# Patient Record
Sex: Male | Born: 1970 | Hispanic: No | Marital: Single | Smoking: Former smoker
Health system: Southern US, Community
[De-identification: ages and names within clinical notes are randomized; demographics above are authoritative.]

## PROBLEM LIST (undated history)

## (undated) DIAGNOSIS — I499 Cardiac arrhythmia, unspecified: Secondary | ICD-10-CM

## (undated) DIAGNOSIS — E7800 Pure hypercholesterolemia, unspecified: Secondary | ICD-10-CM

## (undated) DIAGNOSIS — K709 Alcoholic liver disease, unspecified: Secondary | ICD-10-CM

## (undated) DIAGNOSIS — K529 Noninfective gastroenteritis and colitis, unspecified: Secondary | ICD-10-CM

## (undated) DIAGNOSIS — F1010 Alcohol abuse, uncomplicated: Secondary | ICD-10-CM

## (undated) DIAGNOSIS — I10 Essential (primary) hypertension: Secondary | ICD-10-CM

## (undated) DIAGNOSIS — K2920 Alcoholic gastritis without bleeding: Secondary | ICD-10-CM

## (undated) HISTORY — PX: HERNIA REPAIR: SHX51

## (undated) HISTORY — DX: Cardiac arrhythmia, unspecified: I49.9

## (undated) HISTORY — DX: Essential (primary) hypertension: I10

---

## 2004-03-03 ENCOUNTER — Emergency Department (HOSPITAL_COMMUNITY): Admission: EM | Admit: 2004-03-03 | Discharge: 2004-03-03 | Payer: Self-pay | Admitting: Emergency Medicine

## 2005-06-10 ENCOUNTER — Emergency Department (HOSPITAL_COMMUNITY): Admission: EM | Admit: 2005-06-10 | Discharge: 2005-06-10 | Payer: Self-pay | Admitting: Emergency Medicine

## 2008-12-06 ENCOUNTER — Emergency Department (HOSPITAL_COMMUNITY): Admission: EM | Admit: 2008-12-06 | Discharge: 2008-12-06 | Payer: Self-pay | Admitting: Emergency Medicine

## 2010-12-27 ENCOUNTER — Emergency Department (HOSPITAL_COMMUNITY)
Admission: EM | Admit: 2010-12-27 | Discharge: 2010-12-28 | Disposition: A | Discharge status: Home / Self Care | Payer: Self-pay | Attending: Emergency Medicine | Admitting: Emergency Medicine

## 2010-12-27 DIAGNOSIS — H11419 Vascular abnormalities of conjunctiva, unspecified eye: Secondary | ICD-10-CM | POA: Insufficient documentation

## 2010-12-27 DIAGNOSIS — L2989 Other pruritus: Secondary | ICD-10-CM | POA: Insufficient documentation

## 2010-12-27 DIAGNOSIS — J309 Allergic rhinitis, unspecified: Secondary | ICD-10-CM | POA: Insufficient documentation

## 2010-12-27 DIAGNOSIS — H5789 Other specified disorders of eye and adnexa: Secondary | ICD-10-CM | POA: Insufficient documentation

## 2010-12-27 DIAGNOSIS — H11429 Conjunctival edema, unspecified eye: Secondary | ICD-10-CM | POA: Insufficient documentation

## 2010-12-27 DIAGNOSIS — J3489 Other specified disorders of nose and nasal sinuses: Secondary | ICD-10-CM | POA: Insufficient documentation

## 2010-12-27 DIAGNOSIS — H1010 Acute atopic conjunctivitis, unspecified eye: Secondary | ICD-10-CM | POA: Insufficient documentation

## 2010-12-27 DIAGNOSIS — L298 Other pruritus: Secondary | ICD-10-CM | POA: Insufficient documentation

## 2010-12-27 DIAGNOSIS — R6889 Other general symptoms and signs: Secondary | ICD-10-CM | POA: Insufficient documentation

## 2012-03-17 ENCOUNTER — Encounter (HOSPITAL_COMMUNITY): Payer: Self-pay | Admitting: Emergency Medicine

## 2012-03-17 ENCOUNTER — Emergency Department (HOSPITAL_COMMUNITY)
Admission: EM | Admit: 2012-03-17 | Discharge: 2012-03-18 | Disposition: A | Discharge status: Home / Self Care | Payer: Self-pay | Attending: Emergency Medicine | Admitting: Emergency Medicine

## 2012-03-17 DIAGNOSIS — S30860A Insect bite (nonvenomous) of lower back and pelvis, initial encounter: Secondary | ICD-10-CM | POA: Insufficient documentation

## 2012-03-17 DIAGNOSIS — W57XXXA Bitten or stung by nonvenomous insect and other nonvenomous arthropods, initial encounter: Secondary | ICD-10-CM | POA: Insufficient documentation

## 2012-03-17 DIAGNOSIS — M795 Residual foreign body in soft tissue: Secondary | ICD-10-CM | POA: Insufficient documentation

## 2012-03-17 DIAGNOSIS — S30861A Insect bite (nonvenomous) of abdominal wall, initial encounter: Secondary | ICD-10-CM

## 2012-03-17 MED ORDER — DOXYCYCLINE HYCLATE 100 MG PO CAPS: 100.0000 mg | ORAL_CAPSULE | Freq: Two times a day (BID) | ORAL | Status: AC

## 2012-03-17 NOTE — Discharge Instructions (Signed)
Return to ED for any redness fever or increase in pain.

## 2012-03-17 NOTE — ED Notes (Signed)
Patient has tick in bedded in his umbilical area

## 2012-03-17 NOTE — ED Provider Notes (Signed)
History     CSN: [id number]  Arrival date & time 03/17/12  2134   First MD Initiated Contact with Patient 03/17/12 2242      Chief Complaint  Patient presents with  . Tick Removal      (Csider location/radiation/quality/duration/timing/severity/associated sxs/prior treatment) The history is provided by the patient and a relative. The history is limited by a language barrier.    41 y/o male INAD c/o tick bite to umbilical area. Pt noticed tick today does not know how long it was attached. Pt works in [employer].    History reviewed. No pertinent past medical history.  Past Surgical History  Procedure Date  . Hernia repair     History reviewed. No pertinent family history.  History  Substance Use Topics  . Smoking status: Never Smoker   . Smokeless tobacco: Not on file  . Alcohol Use: Yes     occ.      Review of Systems  All other systems reviewed and are negative.    Allergies  Review of patient's allergies indicates no known allergies.  Home Medications  No current outpatient prescriptions on file.  BP 119/69  Pulse 71  Temp 98.6 F (37 C) (Oral)  Resp 16  SpO2 100%  Physical Exam  Vitals reviewed. Constitutional: He is oriented to person, place, and time. He appears well-developed and well-nourished. No distress.  HENT:  Head: Normocephalic.  Eyes: Conjunctivae and EOM are normal.  Cardiovascular: Normal rate.   Pulmonary/Chest: Effort normal.  Abdominal: Soft. Bowel sounds are normal.       Engorged tick inside umbilicus.   Musculoskeletal: Normal range of motion.  Neurological: He is alert and oriented to person, place, and time.  Psychiatric: He has a normal mood and affect.    ED Course  Procedures (including critical care time)  Labs Reviewed - No data to display No results found.   No diagnosis found.  Tick and all mouthparts removed with gentle traction secured with hemostat.   MDM  Engorged tick removed from umbilicus. Lyme  prophylaxis given. Extended dose of Doxy given for soft tissue infection prophylaxis.         Wynetta Emery, PA-C 03/19/12 1432

## 2012-03-19 NOTE — ED Provider Notes (Signed)
Medical screening examination/treatment/procedure(s) were performed by non-physician practitioner and as supervising physician I was immediately available for consultation/collaboration.   Theodore Rahrig E Tyrail Grandfield, MD 03/19/12 1539 

## 2012-05-07 ENCOUNTER — Emergency Department (HOSPITAL_COMMUNITY)
Admission: EM | Admit: 2012-05-07 | Discharge: 2012-05-07 | Disposition: A | Discharge status: Home / Self Care | Payer: Self-pay | Attending: Emergency Medicine | Admitting: Emergency Medicine

## 2012-05-07 ENCOUNTER — Encounter (HOSPITAL_COMMUNITY): Payer: Self-pay | Admitting: *Deleted

## 2012-05-07 DIAGNOSIS — W57XXXA Bitten or stung by nonvenomous insect and other nonvenomous arthropods, initial encounter: Secondary | ICD-10-CM | POA: Insufficient documentation

## 2012-05-07 DIAGNOSIS — S30860A Insect bite (nonvenomous) of lower back and pelvis, initial encounter: Secondary | ICD-10-CM | POA: Insufficient documentation

## 2012-05-07 DIAGNOSIS — M6740 Ganglion, unspecified site: Secondary | ICD-10-CM | POA: Insufficient documentation

## 2012-05-07 NOTE — ED Provider Notes (Signed)
History     CSN: [id number]  Arrival date & time 05/07/12  1746   First MD Initiated Contact with Patient 05/07/12 1904      Chief Complaint  Patient presents with  . Insect Bite    (Consider location/radiation/quality/duration/timing/severity/associated sxs/prior treatment) HPI Comments: Patient with tick removal several weeks ago from his umbilicus. Patient returns today because he feels like there is "something moving" inside the wound. Patient denies redness, swelling, pain in the area, or drainage from the area. Patient had a umbilical hernia repair performed approximately 20 years ago. He's been putting antibiotic ointment on the area. No other treatments prior. Onset was acute. Course is constant. Nothing makes the symptoms better or worse. Patient is concerned about infection.  Patient also has had a swelling to the dorsum of his right wrist for the past year. It is mildly tender. No redness, warmth, drainage. No prior treatment.  The history is provided by the patient and medical records.    History reviewed. No pertinent past medical history.  Past Surgical History  Procedure Date  . Hernia repair     No family history on file.  History  Substance Use Topics  . Smoking status: Never Smoker   . Smokeless tobacco: Not on file  . Alcohol Use: Yes     occ.      Review of Systems  Constitutional: Negative for fever.  Respiratory: Negative for cough.   Cardiovascular: Negative for chest pain.  Gastrointestinal: Negative for nausea, vomiting, abdominal pain and diarrhea.  Musculoskeletal: Positive for joint swelling. Negative for myalgias.  Skin: Positive for wound. Negative for rash.  Neurological: Negative for headaches.  Hematological: Negative for adenopathy.    Allergies  Review of patient's allergies indicates no known allergies.  Home Medications   Current Outpatient Rx  Name Route Sig Dispense Refill  . IBUPROFEN 200 MG PO TABS Oral Take 200 mg by  mouth every 6 (six) hours as needed. pain      BP 120/67  Pulse 74  Temp 98.9 F (37.2 C) (Oral)  Resp 17  SpO2 98%  Physical Exam  Nursing note and vitals reviewed. Constitutional: He appears well-developed and well-nourished.  HENT:  Head: Normocephalic and atraumatic.  Eyes: Conjunctivae are normal. Right eye exhibits no discharge. Left eye exhibits no discharge.  Neck: Normal range of motion. Neck supple.  Cardiovascular: Normal rate, regular rhythm and normal heart sounds.   Pulmonary/Chest: Effort normal and breath sounds normal.  Abdominal: Soft. There is no tenderness.  Musculoskeletal: Normal range of motion. He exhibits tenderness. He exhibits no edema.       Right wrist: He exhibits tenderness and swelling. He exhibits normal range of motion.       Patient with 1 cm, rubbery nodule on dorsum of right wrist consistent with ganglion cyst. There is no overlying erythema, warmth. Do not suspect infection. Mildly tender to palpation.  Neurological: He is alert.  Skin: Skin is warm and dry.       Area of thickened skin on the superior aspect of the inner umbilicus. There is no surrounding redness or swelling that would indicate infection, abscess, cellulitis. There is no drainage or crusting  Psychiatric: He has a normal mood and affect.    ED Course  Procedures (including critical care time)  Labs Reviewed - No data to display No results found.   1. Insect bite   2. Ganglion cyst     7:18 PM Patient seen and examined.  Vital signs reviewed and are as follows: Filed Vitals:   05/07/12 1809  BP: 120/67  Pulse: 74  Temp: 98.9 F (37.2 C)  Resp: 17   The patient was urged to return to the Emergency Department urgently with worsening pain, swelling, expanding erythema especially if it streaks away from the affected area, fever, or if they have any other concerns. Patient verbalized understanding.   Counseled on supportive treatment for ganglion.    MDM    Patient with skin irregularity just inside of his umbilicus. I suspect that this is a localized inflammatory response from his recent tick bites, potentially inflammation from part of the tick that remained within the skin. There is no evidence of abscess formation or cellulitis. No concern for tick-borne illness. Patient will continue watchful waiting for resolution.  Mildly tender ganglion on of right wrist. Conservative measures indicated.        Renne Crigler, [place] 05/07/12 1927

## 2012-05-07 NOTE — ED Notes (Addendum)
Pt reports six weeks ago was seen for tick in umbilicus which was removed.  Pt reports he noted today something moving in umbilicus.  Pt reports he saw some sort of bug which he can not describe or say what color it was. Pt denies pain. Pt would also like to be checked for a hernia

## 2013-09-11 ENCOUNTER — Encounter (HOSPITAL_COMMUNITY): Payer: Self-pay | Admitting: Emergency Medicine

## 2013-09-11 DIAGNOSIS — R1013 Epigastric pain: Secondary | ICD-10-CM | POA: Insufficient documentation

## 2013-09-11 DIAGNOSIS — R7402 Elevation of levels of lactic acid dehydrogenase (LDH): Secondary | ICD-10-CM | POA: Insufficient documentation

## 2013-09-11 DIAGNOSIS — R7401 Elevation of levels of liver transaminase levels: Secondary | ICD-10-CM | POA: Insufficient documentation

## 2013-09-11 DIAGNOSIS — K2920 Alcoholic gastritis without bleeding: Secondary | ICD-10-CM | POA: Insufficient documentation

## 2013-09-11 NOTE — ED Notes (Signed)
Pt. reports upper abdominal with nausea and vomitting / no diarrhea , pt. stated heavy ETOH drinking last night with " hang over " this morning .

## 2013-09-12 ENCOUNTER — Emergency Department (HOSPITAL_COMMUNITY)
Admission: EM | Admit: 2013-09-12 | Discharge: 2013-09-12 | Disposition: A | Discharge status: Home / Self Care | Payer: Self-pay | Attending: Emergency Medicine | Admitting: Emergency Medicine

## 2013-09-12 DIAGNOSIS — R109 Unspecified abdominal pain: Secondary | ICD-10-CM

## 2013-09-12 DIAGNOSIS — K2920 Alcoholic gastritis without bleeding: Secondary | ICD-10-CM

## 2013-09-12 HISTORY — DX: Alcoholic liver disease, unspecified: K70.9

## 2013-09-12 HISTORY — DX: Alcoholic gastritis without bleeding: K29.20

## 2013-09-12 HISTORY — DX: Alcohol abuse, uncomplicated: F10.10

## 2013-09-12 LAB — URINALYSIS, ROUTINE W REFLEX MICROSCOPIC
Glucose, UA: NEGATIVE mg/dL
Ketones, ur: NEGATIVE mg/dL
Leukocytes, UA: NEGATIVE
Nitrite: NEGATIVE
Protein, ur: NEGATIVE mg/dL
pH: 6.5 (ref 5.0–8.0)

## 2013-09-12 LAB — COMPREHENSIVE METABOLIC PANEL
AST: 38 U/L — ABNORMAL HIGH (ref 0–37)
Albumin: 3.8 g/dL (ref 3.5–5.2)
BUN: 10 mg/dL (ref 6–23)
Calcium: 9 mg/dL (ref 8.4–10.5)
Creatinine, Ser: 0.7 mg/dL (ref 0.50–1.35)
Total Protein: 7.5 g/dL (ref 6.0–8.3)

## 2013-09-12 LAB — LIPASE, BLOOD: Lipase: 42 U/L (ref 11–59)

## 2013-09-12 LAB — CBC WITH DIFFERENTIAL/PLATELET
Basophils Absolute: 0 10*3/uL (ref 0.0–0.1)
Basophils Relative: 0 % (ref 0–1)
Eosinophils Absolute: 0 10*3/uL (ref 0.0–0.7)
Eosinophils Relative: 0 % (ref 0–5)
HCT: 43 % (ref 39.0–52.0)
MCH: 30.8 pg (ref 26.0–34.0)
MCHC: 35.3 g/dL (ref 30.0–36.0)
MCV: 87 fL (ref 78.0–100.0)
Monocytes Absolute: 1 10*3/uL (ref 0.1–1.0)
Monocytes Relative: 9 % (ref 3–12)
Neutro Abs: 7.3 10*3/uL (ref 1.7–7.7)
RDW: 12.9 % (ref 11.5–15.5)

## 2013-09-12 MED ORDER — TRAMADOL HCL 50 MG PO TABS: 50.0000 mg | ORAL_TABLET | Freq: Four times a day (QID) | ORAL | Status: DC | PRN

## 2013-09-12 NOTE — ED Notes (Signed)
Pt dc to home. Pt sts understanding to dc instructions. Pt ambulatory to exit without difficulty. 

## 2013-09-12 NOTE — ED Provider Notes (Signed)
CSN: [id number]     Arrival date & time 09/11/13  2259 History   First MD Initiated Contact with Patient 09/12/13 [phone]     Chief Complaint  Patient presents with  . Abdominal Pain   (Consider location/radiation/quality/duration/timing/severity/associated sxs/prior Treatment) HPI Comments: 42 year old male with a history of heavy alcohol use presents with a complaint of abdominal pain which started several hours after drinking 12 beers and 3 tequila shots. He admits to having associated nausea and vomiting but states that this is all very typical after he drinks as much. Over last 6 hours while he has been waiting, his symptoms have essentially resolved, he has minimal residual tenderness, no nausea and no other complaints.  Patient is a 42 y.o. male presenting with abdominal pain. The history is provided by the patient.  Abdominal Pain   Past Medical History  Diagnosis Date  . Alcoholic gastritis   . 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 Alcohol abuse    Past Surgical History  Procedure Laterality Date  . Hernia repair     No family history on file. History  Substance Use Topics  . Smoking status: Never Smoker   . Smokeless tobacco: Not on file  . Alcohol Use: Yes     Comment: occ.    Review of Systems  Gastrointestinal: Positive for abdominal pain.  All other systems reviewed and are negative.    Allergies  Review of patient's allergies indicates no known allergies.  Home Medications   Current Outpatient Rx  Name  Route  Sig  Dispense  Refill  . polyethylene glycol (MIRALAX / GLYCOLAX) packet   Oral   Take 17 g by mouth daily as needed for moderate constipation.         . traMADol (ULTRAM) 50 MG tablet   Oral   Take 1 tablet (50 mg total) by mouth every 6 (six) hours as needed.   10 tablet   0    BP 118/91  Pulse 97  Temp(Src) 98.5 F (36.9 C) (Oral)  Resp 14  Ht 5\' 5"  (1.651 m)  Wt 179 lb (81.194 kg)  BMI 29.79 kg/m2  SpO2 97% Physical Exam  Nursing  note and vitals reviewed. Constitutional: He appears well-developed and well-nourished. No distress.  HENT:  Head: Normocephalic and atraumatic.  Mouth/Throat: Oropharynx is clear and moist. No oropharyngeal exudate.  Eyes: Conjunctivae and EOM are normal. Pupils are equal, round, and reactive to light. Right eye exhibits no discharge. Left eye exhibits no discharge. No scleral icterus.  Neck: Normal range of motion. Neck supple. No JVD present. No thyromegaly present.  Cardiovascular: Normal rate, regular rhythm, normal heart sounds and intact distal pulses.  Exam reveals no gallop and no friction rub.   No murmur heard. Pulmonary/Chest: Effort normal and breath sounds normal. No respiratory distress. He has no wheezes. He has no rales.  Abdominal: Soft. Bowel sounds are normal. He exhibits no distension and no mass. There is tenderness ( Mild epigastric tenderness, no guarding, no other tenderness, no guarding).  Musculoskeletal: Normal range of motion. He exhibits no edema and no tenderness.  Lymphadenopathy:    He has no cervical adenopathy.  Neurological: He is alert. Coordination normal.  Skin: Skin is warm and dry. No rash noted. No erythema.  Psychiatric: He has a normal mood and affect. His behavior is normal.    ED Course  Procedures (including critical care time) Labs Review Labs Reviewed  CBC WITH DIFFERENTIAL - Abnormal; Notable for the following:  WBC 10.9 (*)    All other components within normal limits  COMPREHENSIVE METABOLIC PANEL - Abnormal; Notable for the following:    Potassium 3.1 (*)    Glucose, Bld 153 (*)    AST 38 (*)    ALT 70 (*)    All other components within normal limits  LIPASE, BLOOD  URINALYSIS, ROUTINE W REFLEX MICROSCOPIC   Imaging Review No results found.  EKG Interpretation   None       MDM   1. Abdominal pain   2. Alcoholic gastritis   3. Transaminitis    Overall the patient appears well. He does have slight transaminitis, no  other significant findings of concern and has a benign abdomen. I suspect alcoholic gastritis as a source of his abdominal pain. He is stable for discharge, requests discharge, recommended decreased alcohol intake, understanding expressed.    Vida Roller, MD 09/12/13 [phone]

## 2013-12-23 ENCOUNTER — Encounter (HOSPITAL_COMMUNITY): Payer: Self-pay | Admitting: Emergency Medicine

## 2013-12-23 ENCOUNTER — Emergency Department (HOSPITAL_COMMUNITY): Payer: Self-pay

## 2013-12-23 ENCOUNTER — Emergency Department (HOSPITAL_COMMUNITY)
Admission: EM | Admit: 2013-12-23 | Discharge: 2013-12-23 | Disposition: A | Discharge status: Home / Self Care | Payer: Self-pay | Attending: Emergency Medicine | Admitting: Emergency Medicine

## 2013-12-23 DIAGNOSIS — R109 Unspecified abdominal pain: Secondary | ICD-10-CM | POA: Insufficient documentation

## 2013-12-23 DIAGNOSIS — R197 Diarrhea, unspecified: Secondary | ICD-10-CM | POA: Insufficient documentation

## 2013-12-23 DIAGNOSIS — R209 Unspecified disturbances of skin sensation: Secondary | ICD-10-CM | POA: Insufficient documentation

## 2013-12-23 LAB — URINALYSIS, ROUTINE W REFLEX MICROSCOPIC
Bilirubin Urine: NEGATIVE
Glucose, UA: NEGATIVE mg/dL
Hgb urine dipstick: NEGATIVE
Ketones, ur: NEGATIVE mg/dL
LEUKOCYTES UA: NEGATIVE
Nitrite: NEGATIVE
PH: 5.5 (ref 5.0–8.0)
Protein, ur: NEGATIVE mg/dL
Specific Gravity, Urine: 1.024 (ref 1.005–1.030)
Urobilinogen, UA: 0.2 mg/dL (ref 0.0–1.0)

## 2013-12-23 LAB — CBC WITH DIFFERENTIAL/PLATELET
BASOS ABS: 0 10*3/uL (ref 0.0–0.1)
BASOS PCT: 0 % (ref 0–1)
EOS ABS: 0.1 10*3/uL (ref 0.0–0.7)
Eosinophils Relative: 1 % (ref 0–5)
HEMATOCRIT: 47 % (ref 39.0–52.0)
Hemoglobin: 16.6 g/dL (ref 13.0–17.0)
Lymphocytes Relative: 16 % (ref 12–46)
Lymphs Abs: 1.7 10*3/uL (ref 0.7–4.0)
MCH: 30.1 pg (ref 26.0–34.0)
MCHC: 35.3 g/dL (ref 30.0–36.0)
MCV: 85.3 fL (ref 78.0–100.0)
MONO ABS: 0.9 10*3/uL (ref 0.1–1.0)
Monocytes Relative: 8 % (ref 3–12)
Neutro Abs: 8 10*3/uL — ABNORMAL HIGH (ref 1.7–7.7)
Neutrophils Relative %: 74 % (ref 43–77)
Platelets: 183 10*3/uL (ref 150–400)
RBC: 5.51 MIL/uL (ref 4.22–5.81)
RDW: 12.7 % (ref 11.5–15.5)
WBC: 10.8 10*3/uL — ABNORMAL HIGH (ref 4.0–10.5)

## 2013-12-23 LAB — TROPONIN I: Troponin I: <0.3 ng/mL (ref <0.30)

## 2013-12-23 LAB — COMPREHENSIVE METABOLIC PANEL
ALBUMIN: 4.4 g/dL (ref 3.5–5.2)
ALT: 114 U/L — ABNORMAL HIGH (ref 0–53)
AST: 58 U/L — AB (ref 0–37)
Alkaline Phosphatase: 80 U/L (ref 39–117)
BILIRUBIN TOTAL: 0.9 mg/dL (ref 0.3–1.2)
BUN: 17 mg/dL (ref 6–23)
CALCIUM: 9.4 mg/dL (ref 8.4–10.5)
CHLORIDE: 97 meq/L (ref 96–112)
CO2: 18 mEq/L — ABNORMAL LOW (ref 19–32)
CREATININE: 0.93 mg/dL (ref 0.50–1.35)
GFR calc Af Amer: >90 mL/min (ref >90)
GFR calc non Af Amer: >90 mL/min (ref >90)
Glucose, Bld: 114 mg/dL — ABNORMAL HIGH (ref 70–99)
Potassium: 4.1 mEq/L (ref 3.7–5.3)
Sodium: 132 mEq/L — ABNORMAL LOW (ref 137–147)
TOTAL PROTEIN: 8.5 g/dL — AB (ref 6.0–8.3)

## 2013-12-23 LAB — LIPASE, BLOOD: LIPASE: 40 U/L (ref 11–59)

## 2013-12-23 MED ORDER — DICYCLOMINE HCL 10 MG PO CAPS
10.0000 mg | ORAL_CAPSULE | Freq: Once | ORAL | Status: AC
  Administered 2013-12-23: 10 mg via ORAL
  Filled 2013-12-23: qty 1

## 2013-12-23 MED ORDER — LOPERAMIDE HCL 2 MG PO CAPS
2.0000 mg | ORAL_CAPSULE | ORAL | Status: DC | PRN
  Administered 2013-12-23: 2 mg via ORAL
  Filled 2013-12-23: qty 1

## 2013-12-23 MED ORDER — LOPERAMIDE HCL 2 MG PO CAPS: 2.0000 mg | ORAL_CAPSULE | ORAL | Status: DC | PRN

## 2013-12-23 NOTE — ED Notes (Signed)
Patient c/o abd pain, flank pain, diarrhea, and numbness to both hands. Patient states he took peptobismal and gingerale, did not help.

## 2013-12-23 NOTE — ED Provider Notes (Signed)
Medical screening examination/treatment/procedure(s) were performed by non-physician practitioner and as supervising physician I was immediately available for consultation/collaboration.   EKG Interpretation   Date/Time:  Wednesday December 23 2013 05:18:32 EDT Ventricular Rate:  98 PR Interval:  153 QRS Duration: 87 QT Interval:  347 QTC Calculation: 443 R Axis:   -30 Text Interpretation:  Age not entered, assumed to be  43 years old for  purpose of ECG interpretation Sinus rhythm Left axis deviation Confirmed  by Rhunette CroftNANAVATI, MD, Deann Mclaine ([id number]) on 12/23/2013 7:03:28 AM       Derwood KaplanAnkit Darryle Dennie, MD 12/23/13 [phone]

## 2013-12-23 NOTE — ED Provider Notes (Signed)
CSN: [id number]     Arrival date & time 12/23/13  0428 History   First MD Initiated Contact with Patient 12/23/13 [phone]     Chief Complaint  Patient presents with  . Diarrhea  . Abdominal Pain  . Flank Pain    bilateral  . Numbness    bilateral hands     (Consider location/radiation/quality/duration/timing/severity/associated sxs/prior Treatment) Patient is a 43 y.o. male presenting with diarrhea, abdominal pain, and flank pain. The history is provided by the patient. No language interpreter was used.  Diarrhea Associated symptoms: abdominal pain   Associated symptoms: no chills, no fever and no vomiting   Abdominal Pain Associated symptoms: diarrhea   Associated symptoms: no chest pain, no chills, no constipation, no dysuria, no fatigue, no fever, no hematuria, no nausea, no shortness of breath and no vomiting   Flank Pain Associated symptoms include abdominal pain. Pertinent negatives include no chest pain, chills, fatigue, fever, nausea or vomiting.   Pt is a 43yo male presenting to ED c/o watery diarrhea that started yesterday, associated with right flank pain, abdominal pain, and numbness in both hands.  Pt states he has had >20 episodes of water diarrhea since yesterday. Denies blood or mucous in stool. Pt states right sided flank and abdominal pian is aching and sharp, intermittent, 5/10, nothing makes better or worse.  Pt states he recently traveled to [place] to visit with family for vacation but denies sick contacts. He admits to drinking 5-6 cans of beers per day while on vacation.  He also reports drinking a "juice" of pineapple juice and vinegar which he was drinking because he states he normally gets constipated.  Last drank yesterday but he states he has never had diarrhea this bad after drinking it.  Reports hx of abdominal hernia repair 25 years ago, denies known complications from surgery. Denies fever, nausea or vomiting. Denies hx of renal stones. Denies hx of living  problems.  Denies other significant PMH.   History reviewed. No pertinent past medical history. History reviewed. No pertinent past surgical history. No family history on file. History  Substance Use Topics  . Smoking status: Never Smoker   . Smokeless tobacco: Not on file  . Alcohol Use: Yes     Comment: varies    Review of Systems  Constitutional: Negative for fever, chills, appetite change and fatigue.  Respiratory: Negative for shortness of breath.   Cardiovascular: Negative for chest pain.  Gastrointestinal: Positive for abdominal pain and diarrhea. Negative for nausea, vomiting and constipation.  Genitourinary: Positive for flank pain ( right). Negative for dysuria and hematuria.  All other systems reviewed and are negative.     Allergies  Review of patient's allergies indicates no known allergies.  Home Medications   Current Outpatient Rx  Name  Route  Sig  Dispense  Refill  . bismuth subsalicylate (PEPTO BISMOL) 262 MG/15ML suspension   Oral   Take 30 mLs by mouth every 6 (six) hours as needed for indigestion or diarrhea or loose stools.         Marland Kitchen loperamide (IMODIUM) 2 MG capsule   Oral   Take 1 capsule (2 mg total) by mouth as needed for diarrhea or loose stools. Take 1 pill as needed for diarrhea, do not exceed 16mg /day (8 tabs)   30 capsule   0    BP 128/69  Pulse 83  Temp(Src) 98.2 F (36.8 C) (Oral)  Resp 16  Ht 5\' 5"  (1.651 m)  Wt 173 lb (  78.472 kg)  BMI 28.79 kg/m2  SpO2 94% Physical Exam  Nursing note and vitals reviewed. Constitutional: He appears well-developed and well-nourished.  HENT:  Head: Normocephalic and atraumatic.  Eyes: Conjunctivae are normal. No scleral icterus.  Neck: Normal range of motion.  Cardiovascular: Normal rate, regular rhythm and normal heart sounds.   Pulmonary/Chest: Effort normal and breath sounds normal. No respiratory distress. He has no wheezes. He has no rales. He exhibits no tenderness.  Abdominal: Soft.  Bowel sounds are normal. He exhibits no distension and no mass. There is tenderness. There is no rebound, no guarding and no CVA tenderness.  Soft, obese abdomen, tenderness in right abdomen, greatest in RUQ.  No epigastric pain. No rebound, guarding, or masses.   Musculoskeletal: Normal range of motion.  Neurological: He is alert.  Skin: Skin is warm and dry.    ED Course  Procedures (including critical care time) Labs Review Labs Reviewed  CBC WITH DIFFERENTIAL - Abnormal; Notable for the following:    WBC 10.8 (*)    Neutro Abs 8.0 (*)    All other components within normal limits  COMPREHENSIVE METABOLIC PANEL - Abnormal; Notable for the following:    Sodium 132 (*)    CO2 18 (*)    Glucose, Bld 114 (*)    Total Protein 8.5 (*)    AST 58 (*)    ALT 114 (*)    All other components within normal limits  LIPASE, BLOOD  TROPONIN I  URINALYSIS, ROUTINE W REFLEX MICROSCOPIC   Imaging Review US Abdomen Complete  12/23/2013   CLINICAL DATA:  Right upper quadrant pain. Elevated liver function tests.  EXAM: ULTRASOUND ABDOMEN COMPLETE  COMPARISON:  None.  FINDINGS: Gallbladder:  No gallstones or wall thickening visualized. No sonographic Murphy sign noted.  Common bile duct:  Diameter: 0.4 cm  Liver:  Demonstrates increased echogenicity and coarsened echotexture. No focal lesion or intrahepatic biliary ductal dilatation. There is normal hepatopetal flow in the portal vein.  IVC:  No abnormality visualized.  Pancreas:  Visualized portion unremarkable.  Spleen:  Size and appearance within normal limits.  Right Kidney:  Length: 11.6 cm. Echogenicity within normal limits. No mass or hydronephrosis visualized.  Left Kidney:  Length: 12.1 cm. Echogenicity within normal limits. No mass or hydronephrosis visualized.  Abdominal aorta:  No aneurysm visualized.  Other findings:  None.  IMPRESSION: Fatty infiltration of the liver. The examination is otherwise negative.   Electronically Signed   By: Drusilla Kanner M.D.   On: 12/23/2013 07:52     EKG Interpretation   Date/Time:  Wednesday December 23 2013 05:18:32 EDT Ventricular Rate:  98 PR Interval:  153 QRS Duration: 87 QT Interval:  347 QTC Calculation: 443 R Axis:   -30 Text Interpretation:  Age not entered, assumed to be  43 years old for  purpose of ECG interpretation Sinus rhythm Left axis deviation Confirmed  by Rhunette Croft, MD, ANKIT [phone]) on 12/23/2013 7:03:28 AM      MDM   Final diagnoses:  Diarrhea  Abdominal pain    Pt is a 43yo male c/o watery diarrhea that started yesterday. Denies fever, nausea or vomiting. Pt appears well hydrated. NAD. Vitals: unremarkable. Abd-soft, obese, mild tenderness in right side of abdomen.  Low concern for surgical abdomen.    Labs: unremarkable, except slightly elevated AST-58 and ALT-114.  Abd U/S- significant for fatty infiltration of liver, otherwise negative exam.    Vitals: pt afebrile in ED, normal BP.  Discussed  pt with Dr. Rhunette CroftNanavati, not concerned for emergent process taking place at this time. Symptoms likely viral in nature. Will tx symptomatically with imodium.  Will discharge pt home and have him f/u with [facility].  Return precautions provided. Pt verbalized understanding and agreement with tx plan.       Junius FinnerErin O'Malley, PA-C 12/23/13 [id number]

## 2014-08-26 ENCOUNTER — Emergency Department (HOSPITAL_COMMUNITY)
Admission: EM | Admit: 2014-08-26 | Discharge: 2014-08-27 | Disposition: A | Discharge status: Home / Self Care | Payer: Self-pay | Attending: Emergency Medicine | Admitting: Emergency Medicine

## 2014-08-26 ENCOUNTER — Encounter (HOSPITAL_COMMUNITY): Payer: Self-pay | Admitting: Emergency Medicine

## 2014-08-26 DIAGNOSIS — R1032 Left lower quadrant pain: Secondary | ICD-10-CM

## 2014-08-26 DIAGNOSIS — Z8639 Personal history of other endocrine, nutritional and metabolic disease: Secondary | ICD-10-CM | POA: Insufficient documentation

## 2014-08-26 DIAGNOSIS — K5900 Constipation, unspecified: Secondary | ICD-10-CM | POA: Insufficient documentation

## 2014-08-26 HISTORY — DX: Pure hypercholesterolemia, unspecified: E78.00

## 2014-08-26 NOTE — ED Notes (Signed)
Pt. reports intermittent LLQ onset yesterday , denies nausea or vomitting , no diarrhea/ denies fever or chills.

## 2014-08-27 ENCOUNTER — Emergency Department (HOSPITAL_COMMUNITY): Payer: Self-pay

## 2014-08-27 LAB — COMPREHENSIVE METABOLIC PANEL
ALBUMIN: 3.9 g/dL (ref 3.5–5.2)
ALK PHOS: 80 U/L (ref 39–117)
ALT: 31 U/L (ref 0–53)
AST: 24 U/L (ref 0–37)
Anion gap: 16 — ABNORMAL HIGH (ref 5–15)
BILIRUBIN TOTAL: 0.3 mg/dL (ref 0.3–1.2)
BUN: 11 mg/dL (ref 6–23)
CO2: 22 mEq/L (ref 19–32)
Calcium: 9.3 mg/dL (ref 8.4–10.5)
Chloride: 101 mEq/L (ref 96–112)
Creatinine, Ser: 0.73 mg/dL (ref 0.50–1.35)
GFR calc Af Amer: >90 mL/min (ref >90)
GFR calc non Af Amer: >90 mL/min (ref >90)
GLUCOSE: 121 mg/dL — AB (ref 70–99)
POTASSIUM: 3.7 meq/L (ref 3.7–5.3)
Sodium: 139 mEq/L (ref 137–147)
Total Protein: 7.2 g/dL (ref 6.0–8.3)

## 2014-08-27 LAB — CBC WITH DIFFERENTIAL/PLATELET
Basophils Absolute: 0 10*3/uL (ref 0.0–0.1)
Basophils Relative: 0 % (ref 0–1)
Eosinophils Absolute: 0.1 10*3/uL (ref 0.0–0.7)
Eosinophils Relative: 2 % (ref 0–5)
HCT: 42.5 % (ref 39.0–52.0)
HEMOGLOBIN: 14.8 g/dL (ref 13.0–17.0)
LYMPHS ABS: 2.6 10*3/uL (ref 0.7–4.0)
Lymphocytes Relative: 42 % (ref 12–46)
MCH: 30.1 pg (ref 26.0–34.0)
MCHC: 34.8 g/dL (ref 30.0–36.0)
MCV: 86.4 fL (ref 78.0–100.0)
MONOS PCT: 8 % (ref 3–12)
Monocytes Absolute: 0.5 10*3/uL (ref 0.1–1.0)
NEUTROS ABS: 3 10*3/uL (ref 1.7–7.7)
NEUTROS PCT: 48 % (ref 43–77)
Platelets: 202 10*3/uL (ref 150–400)
RBC: 4.92 MIL/uL (ref 4.22–5.81)
RDW: 12.9 % (ref 11.5–15.5)
WBC: 6.2 10*3/uL (ref 4.0–10.5)

## 2014-08-27 LAB — URINALYSIS, ROUTINE W REFLEX MICROSCOPIC
Bilirubin Urine: NEGATIVE
Glucose, UA: NEGATIVE mg/dL
HGB URINE DIPSTICK: NEGATIVE
KETONES UR: NEGATIVE mg/dL
Leukocytes, UA: NEGATIVE
Nitrite: NEGATIVE
PH: 6.5 (ref 5.0–8.0)
Protein, ur: NEGATIVE mg/dL
SPECIFIC GRAVITY, URINE: 1.003 — AB (ref 1.005–1.030)
Urobilinogen, UA: 0.2 mg/dL (ref 0.0–1.0)

## 2014-08-27 MED ORDER — MAGNESIUM CITRATE PO SOLN
1.0000 | Freq: Once | ORAL | Status: AC
  Administered 2014-08-27: 1 via ORAL
  Filled 2014-08-27: qty 296

## 2014-08-27 NOTE — ED Provider Notes (Signed)
CSN: [id number]     Arrival date & time 08/26/14  2332 History  This chart was scribed for Edward Boozeavid Jalen Daluz, MD by Freida Busmaniana Omoyeni, ED Scribe. This patient was seen in room D35C/D35C and the patient's care was started 2:00 AM.   Chief Complaint  Patient presents with  . Abdominal Pain     The history is provided by the patient. No language interpreter was used.     HPI Comments:  Edward Harrell is a 43 y.o. male who presents to the Emergency Department complaining of intermittent mild LLQ abdominal pain that started yesterday. Pt reports h/o of similar pain a few months ago. He also reports associated constipation. He notes pain is exacerbated when he holds his pee and alleviated after a BM. He denies nausea, fever, chills and diarrhea.     Past Medical History  Diagnosis Date  . Hypercholesterolemia    Past Surgical History  Procedure Laterality Date  . Hernia repair     No family history on file. History  Substance Use Topics  . Smoking status: Never Smoker   . Smokeless tobacco: Not on file  . Alcohol Use: Yes     Comment: varies    Review of Systems  Constitutional: Negative for fever and chills.  Gastrointestinal: Positive for abdominal pain and constipation. Negative for nausea, vomiting and diarrhea.  All other systems reviewed and are negative.     Allergies  Review of patient's allergies indicates no known allergies.  Home Medications   Prior to Admission medications   Medication Sig Start Date End Date Taking? Authorizing Provider  bismuth subsalicylate (PEPTO BISMOL) 262 MG/15ML suspension Take 30 mLs by mouth every 6 (six) hours as needed for indigestion or diarrhea or loose stools.   Yes Historical Provider, MD  loperamide (IMODIUM) 2 MG capsule Take 1 capsule (2 mg total) by mouth as needed for diarrhea or loose stools. Take 1 pill as needed for diarrhea, do not exceed 16mg /day (8 tabs) 12/23/13  Yes Junius FinnerErin O'Malley, PA-C  OVER THE COUNTER MEDICATION Take 1  tablet by mouth daily as needed (pain/fever). "xl3"   Yes Historical Provider, MD   BP 117/69 mmHg  Pulse 106  Temp(Src) 97.5 F (36.4 C) (Oral)  Resp 21  Ht 5\' 4"  (1.626 m)  Wt 165 lb (74.844 kg)  BMI 28.31 kg/m2  SpO2 100% Physical Exam  Constitutional: He is oriented to person, place, and time. He appears well-developed and well-nourished.  HENT:  Head: Normocephalic and atraumatic.  Eyes: Conjunctivae are normal. Pupils are equal, round, and reactive to light.  Neck: Neck supple. No JVD present.  Cardiovascular: Normal rate and regular rhythm.   Pulmonary/Chest: Effort normal and breath sounds normal.  Abdominal: Soft. Bowel sounds are normal. He exhibits no distension and no mass. There is no tenderness.  Genitourinary: Rectum normal and prostate normal.  Normal sized prostate  Musculoskeletal: Normal range of motion. He exhibits no edema.  Lymphadenopathy:    He has no cervical adenopathy.  Neurological: He is alert and oriented to person, place, and time. He has normal reflexes. No cranial nerve deficit. Coordination normal.  Skin: Skin is warm and dry. No rash noted.  Psychiatric: He has a normal mood and affect. His behavior is normal. Thought content normal.  Nursing note and vitals reviewed.   ED Course  Procedures   DIAGNOSTIC STUDIES:  Oxygen Saturation is 99% on RA, normal by my interpretation.    COORDINATION OF CARE:  2:03 AM Discussed treatment plan  with pt at bedside and pt agreed to plan.  Labs Review Results for orders placed or performed during the hospital encounter of 08/26/14  CBC with Differential  Result Value Ref Range   WBC 6.2 4.0 - 10.5 K/uL   RBC 4.92 4.22 - 5.81 MIL/uL   Hemoglobin 14.8 13.0 - 17.0 g/dL   HCT 16.142.5 09.639.0 - 04.552.0 %   MCV 86.4 78.0 - 100.0 fL   MCH 30.1 26.0 - 34.0 pg   MCHC 34.8 30.0 - 36.0 g/dL   RDW 40.912.9 81.111.5 - 91.415.5 %   Platelets 202 150 - 400 K/uL   Neutrophils Relative % 48 43 - 77 %   Neutro Abs 3.0 1.7 - 7.7  K/uL   Lymphocytes Relative 42 12 - 46 %   Lymphs Abs 2.6 0.7 - 4.0 K/uL   Monocytes Relative 8 3 - 12 %   Monocytes Absolute 0.5 0.1 - 1.0 K/uL   Eosinophils Relative 2 0 - 5 %   Eosinophils Absolute 0.1 0.0 - 0.7 K/uL   Basophils Relative 0 0 - 1 %   Basophils Absolute 0.0 0.0 - 0.1 K/uL  Comprehensive metabolic panel  Result Value Ref Range   Sodium 139 137 - 147 mEq/L   Potassium 3.7 3.7 - 5.3 mEq/L   Chloride 101 96 - 112 mEq/L   CO2 22 19 - 32 mEq/L   Glucose, Bld 121 (H) 70 - 99 mg/dL   BUN 11 6 - 23 mg/dL   Creatinine, Ser 7.820.73 0.50 - 1.35 mg/dL   Calcium 9.3 8.4 - 95.610.5 mg/dL   Total Protein 7.2 6.0 - 8.3 g/dL   Albumin 3.9 3.5 - 5.2 g/dL   AST 24 0 - 37 U/L   ALT 31 0 - 53 U/L   Alkaline Phosphatase 80 39 - 117 U/L   Total Bilirubin 0.3 0.3 - 1.2 mg/dL   GFR calc non Af Amer >90 >90 mL/min   GFR calc Af Amer >90 >90 mL/min   Anion gap 16 (H) 5 - 15  Urinalysis, Routine w reflex microscopic  Result Value Ref Range   Color, Urine YELLOW YELLOW   APPearance CLOUDY (A) CLEAR   Specific Gravity, Urine 1.003 (L) 1.005 - 1.030   pH 6.5 5.0 - 8.0   Glucose, UA NEGATIVE NEGATIVE mg/dL   Hgb urine dipstick NEGATIVE NEGATIVE   Bilirubin Urine NEGATIVE NEGATIVE   Ketones, ur NEGATIVE NEGATIVE mg/dL   Protein, ur NEGATIVE NEGATIVE mg/dL   Urobilinogen, UA 0.2 0.0 - 1.0 mg/dL   Nitrite NEGATIVE NEGATIVE   Leukocytes, UA NEGATIVE NEGATIVE   Imaging Review Dg Abd 1 View  08/27/2014   CLINICAL DATA:  Left lower quadrant pain beginning last night.  EXAM: ABDOMEN - 1 VIEW  COMPARISON:  None.  FINDINGS: Gas and stool in the colon. No small or large bowel distention. No radiopaque stones. Visualized bones appear intact.  IMPRESSION: Nonobstructive bowel gas pattern.   Electronically Signed   By: Burman NievesWilliam  Stevens M.D.   On: 08/27/2014 02:46   Images viewed by me.   MDM   Final diagnoses:  LLQ pain  Constipation, unspecified constipation type    Abdominal pain which seems  likely to be due to constipation. Pain does seem to be relieved with bowel movement. X-ray shows moderate amount of stool which is primarily in the right colon. Remainder of workup is unremarkable. He is given a dose of magnesium citrate in the ED and is referred to gastroenterology for  follow-up if symptoms are recurring.  I personally performed the services described in this documentation, which was scribed in my presence. The recorded information has been reviewed and is accurate.       Edward Booze, MD 08/27/14 [phone]

## 2014-08-27 NOTE — Discharge Instructions (Signed)
Abdominal Pain °Many things can cause abdominal pain. Usually, abdominal pain is not caused by a disease and will improve without treatment. It can often be observed and treated at home. Your health care provider will do a physical exam and possibly order blood tests and X-rays to help determine the seriousness of your pain. However, in many cases, more time must pass before a clear cause of the pain can be found. Before that point, your health care provider may not know if you need more testing or further treatment. °HOME CARE INSTRUCTIONS  °Monitor your abdominal pain for any changes. The following actions may help to alleviate any discomfort you are experiencing: °· Only take over-the-counter or prescription medicines as directed by your health care provider. °· Do not take laxatives unless directed to do so by your health care provider. °· Try a clear liquid diet (broth, tea, or water) as directed by your health care provider. Slowly move to a bland diet as tolerated. °SEEK MEDICAL CARE IF: °· You have unexplained abdominal pain. °· You have abdominal pain associated with nausea or diarrhea. °· You have pain when you urinate or have a bowel movement. °· You experience abdominal pain that wakes you in the night. °· You have abdominal pain that is worsened or improved by eating food. °· You have abdominal pain that is worsened with eating fatty foods. °· You have a fever. °SEEK IMMEDIATE MEDICAL CARE IF:  °· Your pain does not go away within 2 hours. °· You keep throwing up (vomiting). °· Your pain is felt only in portions of the abdomen, such as the right side or the left lower portion of the abdomen. °· You pass bloody or black tarry stools. °MAKE SURE YOU: °· Understand these instructions.   °· Will watch your condition.   °· Will get help right away if you are not doing well or get worse.   °Document Released: 06/13/2005 Document Revised: 09/08/2013 Document Reviewed: 05/13/2013 °ExitCare® Patient Information  ©2015 [employer]. This information is not intended to replace advice given to you by your health care provider. Make sure you discuss any questions you have with your health care provider. ° °Constipation °Constipation is when a person has fewer than three bowel movements a week, has difficulty having a bowel movement, or has stools that are dry, hard, or larger than normal. As people grow older, constipation is more common. If you try to fix constipation with medicines that make you have a bowel movement (laxatives), the problem may get worse. Long-term laxative use may cause the muscles of the colon to become weak. A low-fiber diet, not taking in enough fluids, and taking certain medicines may make constipation worse.  °CAUSES  °· Certain medicines, such as antidepressants, pain medicine, iron supplements, antacids, and water pills.   °· Certain diseases, such as diabetes, irritable bowel syndrome (IBS), thyroid disease, or depression.   °· Not drinking enough water.   °· Not eating enough fiber-rich foods.   °· Stress or travel.   °· Lack of physical activity or exercise.   °· Ignoring the urge to have a bowel movement.   °· Using laxatives too much.   °SIGNS AND SYMPTOMS  °· Having fewer than three bowel movements a week.   °· Straining to have a bowel movement.   °· Having stools that are hard, dry, or larger than normal.   °· Feeling full or bloated.   °· Pain in the lower abdomen.   °· Not feeling relief after having a bowel movement.   °DIAGNOSIS  °Your health care provider will take   a medical history and perform a physical exam. Further testing may be done for severe constipation. Some tests may include: °· A barium enema X-ray to examine your rectum, colon, and, sometimes, your small intestine.   °· A sigmoidoscopy to examine your lower colon.   °· A colonoscopy to examine your entire colon. °TREATMENT  °Treatment will depend on the severity of your constipation and what is causing it. Some dietary  treatments include drinking more fluids and eating more fiber-rich foods. Lifestyle treatments may include regular exercise. If these diet and lifestyle recommendations do not help, your health care provider may recommend taking over-the-counter laxative medicines to help you have bowel movements. Prescription medicines may be prescribed if over-the-counter medicines do not work.  °HOME CARE INSTRUCTIONS  °· Eat foods that have a lot of fiber, such as fruits, vegetables, whole grains, and beans. °· Limit foods high in fat and processed sugars, such as french fries, hamburgers, cookies, candies, and soda.   °· A fiber supplement may be added to your diet if you cannot get enough fiber from foods.   °· Drink enough fluids to keep your urine clear or pale yellow.   °· Exercise regularly or as directed by your health care provider.   °· Go to the restroom when you have the urge to go. Do not hold it.   °· Only take over-the-counter or prescription medicines as directed by your health care provider. Do not take other medicines for constipation without talking to your health care provider first.   °SEEK IMMEDIATE MEDICAL CARE IF:  °· You have bright red blood in your stool.   °· Your constipation lasts for more than 4 days or gets worse.   °· You have abdominal or rectal pain.   °· You have thin, pencil-like stools.   °· You have unexplained weight loss. °MAKE SURE YOU:  °· Understand these instructions. °· Will watch your condition. °· Will get help right away if you are not doing well or get worse. °Document Released: 06/01/2004 Document Revised: 09/08/2013 Document Reviewed: 06/15/2013 °ExitCare® Patient Information ©2015 [employer]. This information is not intended to replace advice given to you by your health care provider. Make sure you discuss any questions you have with your health care provider. ° °

## 2014-10-21 ENCOUNTER — Encounter (HOSPITAL_COMMUNITY): Payer: Self-pay | Admitting: Emergency Medicine

## 2014-10-21 ENCOUNTER — Emergency Department (HOSPITAL_COMMUNITY)
Admission: EM | Admit: 2014-10-21 | Discharge: 2014-10-22 | Disposition: A | Discharge status: Home / Self Care | Payer: Self-pay | Attending: Emergency Medicine | Admitting: Emergency Medicine

## 2014-10-21 ENCOUNTER — Emergency Department (HOSPITAL_COMMUNITY): Payer: Self-pay

## 2014-10-21 DIAGNOSIS — R12 Heartburn: Secondary | ICD-10-CM | POA: Insufficient documentation

## 2014-10-21 DIAGNOSIS — Z8639 Personal history of other endocrine, nutritional and metabolic disease: Secondary | ICD-10-CM | POA: Insufficient documentation

## 2014-10-21 DIAGNOSIS — R1012 Left upper quadrant pain: Secondary | ICD-10-CM | POA: Insufficient documentation

## 2014-10-21 DIAGNOSIS — Z8719 Personal history of other diseases of the digestive system: Secondary | ICD-10-CM | POA: Insufficient documentation

## 2014-10-21 DIAGNOSIS — R079 Chest pain, unspecified: Secondary | ICD-10-CM | POA: Insufficient documentation

## 2014-10-21 HISTORY — DX: Noninfective gastroenteritis and colitis, unspecified: K52.9

## 2014-10-21 LAB — CBC
HCT: 43 % (ref 39.0–52.0)
HEMOGLOBIN: 15.6 g/dL (ref 13.0–17.0)
MCH: 30.8 pg (ref 26.0–34.0)
MCHC: 36.3 g/dL — ABNORMAL HIGH (ref 30.0–36.0)
MCV: 85 fL (ref 78.0–100.0)
Platelets: 220 10*3/uL (ref 150–400)
RBC: 5.06 MIL/uL (ref 4.22–5.81)
RDW: 12.6 % (ref 11.5–15.5)
WBC: 6.8 10*3/uL (ref 4.0–10.5)

## 2014-10-21 LAB — I-STAT TROPONIN, ED: Troponin i, poc: 0 ng/mL (ref 0.00–0.08)

## 2014-10-21 NOTE — ED Notes (Signed)
Pt states that he has had LUQ abdominal pain and heartburn like pain "all day". Pt states that he can usually drink milk and make the pain go away, but this hasn't helped today.

## 2014-10-21 NOTE — ED Notes (Signed)
Pt. reports mid chest pain , " heart burn " , LUQ pain with mild SOB onset yesterday , denies nausea or diaphoresis .

## 2014-10-22 ENCOUNTER — Encounter: Payer: Self-pay | Admitting: Internal Medicine

## 2014-10-22 LAB — BASIC METABOLIC PANEL
Anion gap: 7 (ref 5–15)
BUN: 10 mg/dL (ref 6–23)
CALCIUM: 9.1 mg/dL (ref 8.4–10.5)
CHLORIDE: 107 mmol/L (ref 96–112)
CO2: 25 mmol/L (ref 19–32)
Creatinine, Ser: 0.75 mg/dL (ref 0.50–1.35)
GFR calc Af Amer: >90 mL/min (ref >90)
GFR calc non Af Amer: >90 mL/min (ref >90)
GLUCOSE: 183 mg/dL — AB (ref 70–99)
Potassium: 3.9 mmol/L (ref 3.5–5.1)
Sodium: 139 mmol/L (ref 135–145)

## 2014-10-22 LAB — LIPASE, BLOOD: Lipase: 48 U/L (ref 11–59)

## 2014-10-22 MED ORDER — GI COCKTAIL ~~LOC~~
30.0000 mL | Freq: Once | ORAL | Status: AC
  Administered 2014-10-22: 30 mL via ORAL
  Filled 2014-10-22: qty 30

## 2014-10-22 MED ORDER — OMEPRAZOLE 20 MG PO CPDR: 20.0000 mg | DELAYED_RELEASE_CAPSULE | Freq: Every day | ORAL | Status: DC

## 2014-10-22 NOTE — ED Provider Notes (Signed)
CSN: [id number]     Arrival date & time 10/21/14  2300 History  This chart was scribed for Joya Gaskins, MD by Richarda Overlie, ED Scribe. This patient was seen in room D34C/D34C and the patient's care was started 1:13 AM.    Chief Complaint  Patient presents with  . Chest Pain   Patient is a 44 y.o. male presenting with chest pain. The history is provided by the patient. No language interpreter was used.  Chest Pain Pain location:  Epigastric Pain quality: burning   Radiates to: "throat" Pain severity:  Unable to specify Onset quality:  Unable to specify Duration:  1 day Timing:  Constant Progression:  Unable to specify Associated symptoms: abdominal pain and headache   Associated symptoms: no diaphoresis, no nausea, no near-syncope, no shortness of breath, no syncope and not vomiting    HPI Comments: Edward Harrell is a 44 y.o. male who presents to the Emergency Department complaining of constant mid CP that started yesterday that he describes as a burning pain. Pt reports associated LUQ abdominal pain, heart burn and a mild posterior  HA. He denies a history of MI or DM. He denies vomiting, SOB, diaphoresis, falls or syncopal events. He reports long h/o "heartburn" and he feels this is similar however it did not improve with milk  He feels the burning travel into throat/neck  Past Medical History  Diagnosis Date  . Hypercholesterolemia   . Colitis    Past Surgical History  Procedure Laterality Date  . Hernia repair     No family history on file. History  Substance Use Topics  . Smoking status: Never Smoker   . Smokeless tobacco: Not on file  . Alcohol Use: Yes     Comment: varies    Review of Systems  Constitutional: Negative for diaphoresis.  Respiratory: Negative for shortness of breath.   Cardiovascular: Positive for chest pain. Negative for syncope and near-syncope.  Gastrointestinal: Positive for abdominal pain. Negative for nausea and vomiting.   Neurological: Positive for headaches.  All other systems reviewed and are negative.    Allergies  Review of patient's allergies indicates no known allergies.  Home Medications   Prior to Admission medications   Medication Sig Start Date End Date Taking? Authorizing Provider  OVER THE COUNTER MEDICATION Take 1 tablet by mouth daily as needed (pain/fever). "xl3"   Yes Historical Provider, MD  omeprazole (PRILOSEC) 20 MG capsule Take 1 capsule (20 mg total) by mouth daily. 10/22/14   Joya Gaskins, MD   BP 106/63 mmHg  Pulse 64  Temp(Src) 97.8 F (36.6 C) (Oral)  Resp 15  SpO2 96% Physical Exam  CONSTITUTIONAL: Well developed/well nourished HEAD: Normocephalic/atraumatic EYES: EOMI/PERRL ENMT: Mucous membranes moist, uvula midline, no stridor, no drooling is noted NECK: supple no meningeal signs SPINE/BACK:entire spine nontender CV: S1/S2 noted, no murmurs/rubs/gallops noted LUNGS: Lungs are clear to auscultation bilaterally, no apparent distress ABDOMEN: soft, nontender, no rebound or guarding, bowel sounds noted throughout abdomen NEURO: Pt is awake/alert/appropriate, moves all extremitiesx4.  No facial droop.   EXTREMITIES: pulses normal/equal, full ROM SKIN: warm, color normal PSYCH: no abnormalities of mood noted, alert and oriented to situation  ED Course  Procedures   DIAGNOSTIC STUDIES: Oxygen Saturation is 96% on RA, normal by my interpretation.    COORDINATION OF CARE: 1:17 AM Discussed treatment plan with pt at bedside and pt agreed to plan.   Pt stable in the Ed He felt improved after GI cocktail He reports  this was very similar to prior episodes of heartburn and it lasted all day I have low suspicion for ACS and troponin/ekg unremarkable (had pain all day) I feel he is appropriate for d/c home Will start prilosec Advised to avoid NSAIDs GI referral given Pt speaks english well, but instructions given in english and spanish BP 111/74 mmHg  Pulse 83   Temp(Src) 97.8 F (36.6 C) (Oral)  Resp 19  SpO2 99%   Labs Review Labs Reviewed  CBC - Abnormal; Notable for the following:    MCHC 36.3 (*)    All other components within normal limits  BASIC METABOLIC PANEL - Abnormal; Notable for the following:    Glucose, Bld 183 (*)    All other components within normal limits  LIPASE, BLOOD  I-STAT TROPOININ, ED    Imaging Review Dg Chest 2 View  10/22/2014   CLINICAL DATA:  Chest and abdominal pain.  EXAM: CHEST  2 VIEW  COMPARISON:  None.  FINDINGS: Lung volumes are low. There is atelectasis at the lung bases, in the lingula and right middle lobe. The heart is at the upper limits of normal in size, likely accentuated by low lung volumes. Pulmonary vasculature is normal for technique. No consolidation, pleural effusion, or pneumothorax. No acute osseous abnormalities are seen.  IMPRESSION: Hypoventilatory chest with atelectasis at the lung bases. The heart is at the upper limits of normal in size, likely accentuated by low lung volumes.   Electronically Signed   By: Rubye OaksMelanie  Ehinger M.D.   On: 10/22/2014 00:42     EKG Interpretation   Date/Time:  Thursday October 21 2014 23:08:43 EST Ventricular Rate:  71 PR Interval:  148 QRS Duration: 86 QT Interval:  374 QTC Calculation: 406 R Axis:   82 Text Interpretation:  Normal sinus rhythm with sinus arrhythmia Normal ECG  No significant change since last tracing Confirmed by Bebe ShaggyWICKLINE  MD, Dorinda HillNALD  [phone]([id number]) on 10/21/2014 11:30:19 PM     Medications  gi cocktail (Maalox,Lidocaine,Donnatal) (30 mLs Oral Given 10/22/14 0151)    MDM   Final diagnoses:  Heart burn  Chest pain, unspecified chest pain type  Left upper quadrant pain    Nursing notes including past medical history and social history reviewed and considered in documentation xrays/imaging reviewed by myself and considered during evaluation Labs/vital reviewed myself and considered during evaluation Previous records reviewed and  considered   I personally performed the services described in this documentation, which was scribed in my presence. The recorded information has been reviewed and is accurate.      Joya Gaskinsonald W Tiwanda Threats, MD 10/22/14 [phone]

## 2014-10-22 NOTE — Discharge Instructions (Signed)

## 2014-10-26 ENCOUNTER — Ambulatory Visit (INDEPENDENT_AMBULATORY_CARE_PROVIDER_SITE_OTHER): Payer: Self-pay | Admitting: Internal Medicine

## 2014-10-26 ENCOUNTER — Encounter: Payer: Self-pay | Admitting: Internal Medicine

## 2014-10-26 VITALS — BP 100/68 | HR 80 | Ht 64.0 in | Wt 175.8 lb

## 2014-10-26 DIAGNOSIS — K639 Disease of intestine, unspecified: Secondary | ICD-10-CM | POA: Insufficient documentation

## 2014-10-26 DIAGNOSIS — K219 Gastro-esophageal reflux disease without esophagitis: Secondary | ICD-10-CM | POA: Insufficient documentation

## 2014-10-26 DIAGNOSIS — K589 Irritable bowel syndrome without diarrhea: Secondary | ICD-10-CM | POA: Insufficient documentation

## 2014-10-26 MED ORDER — DICYCLOMINE HCL 20 MG PO TABS: 20.0000 mg | ORAL_TABLET | Freq: Four times a day (QID) | ORAL | Status: DC | PRN

## 2014-10-26 MED ORDER — POLYETHYLENE GLYCOL 3350 17 GM/SCOOP PO POWD: 17.0000 g | Freq: Every day | ORAL | Status: DC

## 2014-10-26 NOTE — Patient Instructions (Signed)
Use Miralax daily.  Today you have been given a printed rx for dicyclomine to take to the pharmacy.  [facility] has this for a good price.  I appreciate the opportunity to care for you. Stan Headarl Gessner, M.D., [facility]

## 2014-10-26 NOTE — Progress Notes (Signed)
Subjective:   Scribed by Collene Leyden, PA-S   Patient ID: Edward Harrell, male    DOB: 16-Aug-1971, 44 y.o.   MRN: [id number] Cc: LUQ pain HPI  44 y/o Hispanic male presents to the office with intermittent left-sided abdominal pain, mostly LUQ, that radiates to the back.  States pain comes and goes and lasts for 3-4 minutes when he gets it.  Also complains of intermittent distension in the LUQ that goes back down after a while.  Denies N/V/D.  Complains of a long history of constipation where he has to strain to have a movement with stool being hard and ball-like.  Also feels he hasn't evacuated fully and has to have another BM an hour later.  Was recently seen at the ER for burning chest pain and was put on Prilosec which he states has helped him greatly.  Also states that he went to [place] about a month ago and saw a doctor there regarding this same issue and was put on Prucalopride, Nitazoxanide, Spasmoprim.  He stopped taking theses medications as he started having urinary problems where it hurt him to urinate.  Denies blood in stool, anal pain, urinary discharge or other urination problems now.   No Known Allergies Outpatient Prescriptions Prior to Visit  Medication Sig Dispense Refill  . omeprazole (PRILOSEC) 20 MG capsule Take 1 capsule (20 mg total) by mouth daily. 30 capsule 0  . OVER THE COUNTER MEDICATION Take 1 tablet by mouth daily as needed (pain/fever). "xl3"     No facility-administered medications prior to visit.   Past Medical History  Diagnosis Date  . Hypercholesterolemia   . Colitis   . Hypertension   . Arrhythmia   . Alcohol abuse    Past Surgical History  Procedure Laterality Date  . Hernia repair     History   Social History  . Marital Status: Single    Spouse Name: N/A    Number of Children: 3  . Years of Education: N/A   Occupational History  . Landscaper    Social History Main Topics  . Smoking status: Never Smoker   . Smokeless tobacco:  Never Used  . Alcohol Use: 0.0 oz/week    0 Not specified per week     Comment: varies  . Drug Use: No   Social History Narrative   Family History  Problem Relation Age of Onset  . Liver cancer Maternal Uncle   . Stomach cancer Maternal Grandmother   . Diabetes Mother   . Liver disease Maternal Uncle       Review of Systems  HENT: Negative for trouble swallowing.   Respiratory: Negative for cough and chest tightness.   Gastrointestinal: Positive for abdominal pain, constipation and abdominal distention. Negative for nausea, vomiting, diarrhea, blood in stool, anal bleeding and rectal pain.  Genitourinary: Negative for dysuria and difficulty urinating.  Neurological: Negative for light-headedness and headaches.   All other ROS negative.    Objective:   Physical Exam General: Well developed, well nourished, appears in no apparent distress HEENT: Anicteic Sclera. No pharyngeal erythema or exudates  Neck: Supple, no JVD, no masses  Cardiovascular: RRR, S1 S2 auscultated, no rubs, murmurs or gallops.  Respiratory: Clear to auscultation bilaterally with equal chest rise  Abdomen: Mildly Tender to palpation, particularly in the LUQ, + BS, no masses.; No guarding or peritoneal signs Extremities: warm dry without cyanosis clubbing. Neuro: AAOx3, cranial nerves grossly intact.  Skin: Without rashes exudates or nodules.  Psych: Normal  affect and demeanor with intact judgement and insight      Assessment & Plan:   Splenic flexure syndrome IBS (irritable bowel syndrome) -Patient was instructed on increasing fiber in diet  -Instructed to get Miralax OTC and take one dose every night.  -Prescribed Dicyclomine 20 mg  to take PRN when he gets pain on the left side of abdomen  -Follow up as needed.  Gastroesophageal reflux disease, esophagitis presence not specified -well controlled with Prilosec, continue to take as prescribed.   Scribed by Collene LeydenWaqas Rameses Ou, PA-S,  10/26/2014  I have seen the patient myself with Mr. Shelda Altesariq and he served as a Neurosurgeonscribe. I have performed the HPI and PE and formulated the assesment and plan as written.  Iva Booparl E. Gessner, MD, Clement[facility] GrahamFACG

## 2015-02-02 ENCOUNTER — Encounter (HOSPITAL_COMMUNITY): Payer: Self-pay | Admitting: Emergency Medicine

## 2015-02-02 ENCOUNTER — Emergency Department (HOSPITAL_COMMUNITY)
Admission: EM | Admit: 2015-02-02 | Discharge: 2015-02-02 | Disposition: A | Discharge status: Home / Self Care | Payer: Self-pay | Attending: Emergency Medicine | Admitting: Emergency Medicine

## 2015-02-02 DIAGNOSIS — Z8739 Personal history of other diseases of the musculoskeletal system and connective tissue: Secondary | ICD-10-CM | POA: Insufficient documentation

## 2015-02-02 DIAGNOSIS — L299 Pruritus, unspecified: Secondary | ICD-10-CM | POA: Insufficient documentation

## 2015-02-02 DIAGNOSIS — Z8639 Personal history of other endocrine, nutritional and metabolic disease: Secondary | ICD-10-CM | POA: Insufficient documentation

## 2015-02-02 DIAGNOSIS — I10 Essential (primary) hypertension: Secondary | ICD-10-CM | POA: Insufficient documentation

## 2015-02-02 DIAGNOSIS — Z79899 Other long term (current) drug therapy: Secondary | ICD-10-CM | POA: Insufficient documentation

## 2015-02-02 MED ORDER — HYDROXYZINE HCL 10 MG PO TABS: 10.0000 mg | ORAL_TABLET | Freq: Four times a day (QID) | ORAL | Status: DC | PRN

## 2015-02-02 MED ORDER — PREDNISONE 20 MG PO TABS: 40.0000 mg | ORAL_TABLET | Freq: Every day | ORAL | Status: DC

## 2015-02-02 MED ORDER — PERMETHRIN 5 % EX CREA: TOPICAL_CREAM | CUTANEOUS | Status: DC

## 2015-02-02 NOTE — ED Notes (Signed)
Pt. reports itchy skin rashes for 3 weeks at legs , hands , buttocks and scalp.

## 2015-02-02 NOTE — ED Provider Notes (Signed)
CSN: [id number]     Arrival date & time 02/02/15  2046 History  This chart was scribed for Garlon HatchetLisa M Srishti Strnad, PA-C working with Mancel BaleElliott Wentz, MD by Evon Slackerrance Branch, ED Scribe. This patient was seen in room TR09C/TR09C and the patient's care was started at 9:21 PM.     Chief Complaint  Patient presents with  . Rash   Patient is a 44 y.o. male presenting with rash. The history is provided by the patient. No language interpreter was used.  Rash Associated symptoms: no fever    HPI Comments: Edward Harrell is a 44 y.o. male who presents to the Emergency Department complaining of itchy rash on his bilateral legs, hands and scalp for the past 3 weeks. Pt denies new soaps or detergents. Pt denies insect bites or tick bites. Pt states that he has tried OTC cream and benadryl with no relief. Pt denies fever, body aches, headaches, chills, sweats.  No one at home with similar rash.  Past Medical History  Diagnosis Date  . Hypercholesterolemia   . Colitis   . Hypertension   . Arrhythmia   . Alcohol abuse    Past Surgical History  Procedure Laterality Date  . Hernia repair     Family History  Problem Relation Age of Onset  . Liver cancer Maternal Uncle   . Stomach cancer Maternal Grandmother   . Diabetes Mother   . Liver disease Maternal Uncle    History  Substance Use Topics  . Smoking status: Never Smoker   . Smokeless tobacco: Never Used  . Alcohol Use: 0.0 oz/week    0 Standard drinks or equivalent per week     Comment: varies    Review of Systems  Constitutional: Negative for fever.  Skin: Positive for rash.  All other systems reviewed and are negative.     Allergies  Review of patient's allergies indicates no known allergies.  Home Medications   Prior to Admission medications   Medication Sig Start Date End Date Taking? Authorizing Provider  dicyclomine (BENTYL) 20 MG tablet Take 1 tablet (20 mg total) by mouth every 6 (six) hours as needed for spasms. 10/26/14    Iva Booparl E Gessner, MD  Multiple Vitamin (MULTIVITAMIN) tablet Take 1 tablet by mouth daily.    Historical Provider, MD  omeprazole (PRILOSEC) 20 MG capsule Take 1 capsule (20 mg total) by mouth daily. 10/22/14   Zadie Rhineonald Wickline, MD  OVER THE COUNTER MEDICATION Take 1 tablet by mouth daily as needed (pain/fever). "xl3"    Historical Provider, MD  polyethylene glycol powder (MIRALAX) powder Take 17 g by mouth daily. 10/26/14   Iva Booparl E Gessner, MD   BP 120/74 mmHg  Pulse 71  Temp(Src) 98.7 F (37.1 C)  Resp 18  SpO2 96%   Physical Exam  Constitutional: He is oriented to person, place, and time. He appears well-developed and well-nourished. No distress.  HENT:  Head: Normocephalic and atraumatic.  No oral lesions or oral swelling  Eyes: Conjunctivae and EOM are normal. No scleral icterus.  Neck: Neck supple. No tracheal deviation present.  Cardiovascular: Normal rate.   Pulmonary/Chest: Effort normal. No respiratory distress.  Musculoskeletal: Normal range of motion.  Neurological: He is alert and oriented to person, place, and time.  Skin: Skin is warm and dry. Rash noted. Rash is maculopapular.  Few scattered maculopapular lesions on dorsal hands and posterior neck with areas of excoriation; no lesions on palms or soles; no abscess formation or signs of superimposed infection  Psychiatric: He has a normal mood and affect. His behavior is normal.  Nursing note and vitals reviewed.   ED Course  Procedures (including critical care time) DIAGNOSTIC STUDIES: Oxygen Saturation is 96% on RA, adequate  by my interpretation.    COORDINATION OF CARE: 9:26 PM-Discussed treatment plan with pt at bedside and pt agreed to plan.     Labs Review Labs Reviewed - No data to display  Imaging Review No results found.   EKG Interpretation None      MDM   Final diagnoses:  Itching   44 year old male with  Pruritic rash for the past 3 weeks. On exam he has a few scattered macular papular  lesions on dorsal hands and posterior neck with evidence of excoriation. There are no lesions on palms or soles. No oral lesions or oral swelling.  Will cover for possible contact dermatitis vs scabies/mites.  Discussed plan with patient, he/she acknowledged understanding and agreed with plan of care.  Return precautions given for new or worsening symptoms.  I personally performed the services described in this documentation, which was scribed in my presence. The recorded information has been reviewed and is accurate.  Garlon HatchetLisa M Korin Setzler, PA-C 02/02/15 2156  Mancel BaleElliott Wentz, MD 02/03/15 [id number]

## 2015-02-02 NOTE — Discharge Instructions (Signed)
Take the prescribed medication as directed. °Return to the ED for new or worsening symptoms. ° °

## 2015-04-20 ENCOUNTER — Ambulatory Visit (INDEPENDENT_AMBULATORY_CARE_PROVIDER_SITE_OTHER): Payer: No Typology Code available for payment source | Admitting: Family Medicine

## 2015-04-20 VITALS — BP 113/72 | HR 83 | Temp 98.3°F | Resp 16 | Ht 64.25 in | Wt 173.8 lb

## 2015-04-20 DIAGNOSIS — Z131 Encounter for screening for diabetes mellitus: Secondary | ICD-10-CM | POA: Diagnosis not present

## 2015-04-20 DIAGNOSIS — Z113 Encounter for screening for infections with a predominantly sexual mode of transmission: Secondary | ICD-10-CM | POA: Diagnosis not present

## 2015-04-20 DIAGNOSIS — Z1329 Encounter for screening for other suspected endocrine disorder: Secondary | ICD-10-CM

## 2015-04-20 DIAGNOSIS — Z0000 Encounter for general adult medical examination without abnormal findings: Secondary | ICD-10-CM | POA: Diagnosis not present

## 2015-04-20 DIAGNOSIS — Z23 Encounter for immunization: Secondary | ICD-10-CM

## 2015-04-20 DIAGNOSIS — R21 Rash and other nonspecific skin eruption: Secondary | ICD-10-CM | POA: Diagnosis not present

## 2015-04-20 DIAGNOSIS — Z13220 Encounter for screening for lipoid disorders: Secondary | ICD-10-CM

## 2015-04-20 DIAGNOSIS — Z130 Encounter for screening for diseases of the blood and blood-forming organs and certain disorders involving the immune mechanism: Secondary | ICD-10-CM

## 2015-04-20 LAB — POCT GLYCOSYLATED HEMOGLOBIN (HGB A1C): Hemoglobin A1C: 5.5

## 2015-04-20 MED ORDER — NYSTATIN 100000 UNIT/GM EX CREA: 1.0000 "application " | TOPICAL_CREAM | Freq: Two times a day (BID) | CUTANEOUS | Status: DC

## 2015-04-20 NOTE — Progress Notes (Signed)
Chief Complaint:  Chief Complaint  Patient presents with  . Annual Exam  . Abdominal Pain  . Rash    HPI: Edward Harrell is a 44 y.o. male who reports to [facility] today complaining of here for annual PE. He has no complaints.  He has occ upper qaudarant abd pain , no nausea , no vomiting. He gets it randomly. He has been to GI He has had a rash for the last 1 wek in his groin area he gets it when it is hot, when he uses a cream [place] it goes away.  He drinks a lot of alcohol, he drinks about 12-14 beers on Saturday , Sundays. Mostly on weekends.  He says that he can stop himself from drinking, would not consider himself an alcoholic Sexually active, uses condoms regular relationship but when he is able to have sex he will. Is not sure if he is up-to-date on his tetanus. But it has been longer than 5 years.  Past Medical History  Diagnosis Date  . Hypercholesterolemia   . Colitis   . Hypertension   . Arrhythmia   . Alcohol abuse    Past Surgical History  Procedure Laterality Date  . Hernia repair     History   Social History  . Marital Status: Single    Spouse Name: N/A  . Number of Children: 3  . Years of Education: N/A   Occupational History  . Landscaper    Social History Main Topics  . Smoking status: Never Smoker   . Smokeless tobacco: Never Used  . Alcohol Use: 0.0 oz/week    0 Standard drinks or equivalent per week     Comment: varies  . Drug Use: No  . Sexual Activity: Not on file   Other Topics Concern  . None   Social History Narrative   Separated - 2 daughters   Landscaper and server   1 caffeine/day   Family History  Problem Relation Age of Onset  . Liver cancer Maternal Uncle   . Stomach cancer Maternal Grandmother   . Diabetes Mother   . Liver disease Maternal Uncle    No Known Allergies Prior to Admission medications   Medication Sig Start Date End Date Taking? Authorizing Provider  hydrOXYzine (ATARAX/VISTARIL) 10 MG  tablet Take 1 tablet (10 mg total) by mouth every 6 (six) hours as needed for itching. 02/02/15  Yes [name], PA-C  Multiple Vitamin (MULTIVITAMIN) tablet Take 1 tablet by mouth daily.   Yes Historical Provider, MD  omeprazole (PRILOSEC) 20 MG capsule Take 1 capsule (20 mg total) by mouth daily. 10/22/14  Yes [name], MD  OVER THE COUNTER MEDICATION Take 1 tablet by mouth daily as needed (pain/fever). "xl3"   Yes Historical Provider, MD     ROS: The patient denies fevers, chills, night sweats, unintentional weight loss, chest pain, palpitations, wheezing, dyspnea on exertion, nausea, vomiting, abdominal pain, dysuria, hematuria, melena, numbness, weakness, or tingling.   All other systems have been reviewed and were otherwise negative with the exception of those mentioned in the HPI and as above.    PHYSICAL EXAM: Filed Vitals:   04/20/15 1714  BP: 113/72  Pulse: 83  Temp: 98.3 F (36.8 C)  Resp: 16   Body mass index is 29.6 kg/(m^2).   General: Alert, no acute distress HEENT:  Normocephalic, atraumatic, oropharynx patent. EOMI, PERRLA, fundoscopic exam normal, no appreciable thyroid megaly Cardiovascular:  Regular rate and rhythm, no rubs  murmurs or gallops.  No Carotid bruits, radial pulse intact. No pedal edema.  Respiratory: Clear to auscultation bilaterally.  No wheezes, rales, or rhonchi.  No cyanosis, no use of accessory musculature Abdominal: No organomegaly, abdomen is soft and minimally tender along the bilateral lower rib cage, positive bowel sounds. No masses. Skin: + Candida in the bilateral groin area Neurologic: Facial musculature symmetric. Psychiatric: Patient acts appropriately throughout our interaction. Lymphatic: No cervical or submandibular lymphadenopathy Musculoskeletal: Gait intact. No edema, tenderness, 5out of 5 strength, sensation intact, 2 out of 2 DTRs Circumcised male, no text to kill her lesions or masses, negative for inguinal hernia. He  is slightly tender on the left side on inguinal hernia checked. But there is no obvious bulges.   LABS: Results for orders placed or performed in visit on 04/20/15  POCT glycosylated hemoglobin (Hb A1C)  Result Value Ref Range   Hemoglobin A1C 5.5      EKG/XRAY:   Primary read interpreted by Dr. Conley Rolls at [facility].   ASSESSMENT/PLAN: Encounter Diagnoses  Name Primary?  . Annual physical exam Yes  . Screening for deficiency anemia   . Screening for hyperlipidemia   . Screening for thyroid disorder   . Screening for STD (sexually transmitted disease)   . Rash and nonspecific skin eruption   . Screening for diabetes mellitus   . Need for vaccination    Pleasant 44 year old Hispanic male with past medical history GERD, chronic intermittent abdominal pain, presents for an annual exam, recurrence of intermittent abdominal pain and also rash in his groin area. Labs pending for annual visit He has been to GI for his abdominal symptoms, I have advised him to continue with his GERD medications and also to stop drinking excessively. He denies having an alcohol problem. 0 out of 4 CAGE questions. Given a tetanus vaccine today Prescribed nystatin cream for candida in the groin area Fu Up as needed  Gross sideeffects, risk and benefits, and alternatives of medications d/w patient. Patient is aware that all medications have potential sideeffects and we are unable to predict every sideeffect or drug-drug interaction that may occur.  Thao Le DO  04/21/2015 7:49 AM

## 2015-04-21 LAB — COMPLETE METABOLIC PANEL WITH GFR
ALT: 45 U/L (ref 9–46)
Alkaline Phosphatase: 61 U/L (ref 40–115)
BUN: 15 mg/dL (ref 7–25)
Calcium: 9.3 mg/dL (ref 8.6–10.3)
Creat: 0.81 mg/dL (ref 0.60–1.35)
GFR, Est Non African American: >89 mL/min (ref >=60)
Sodium: 138 mmol/L (ref 135–146)
Total Bilirubin: 0.3 mg/dL (ref 0.2–1.2)

## 2015-04-21 LAB — LIPID PANEL
Cholesterol: 162 mg/dL (ref 125–200)
HDL: 31 mg/dL — ABNORMAL LOW (ref >=40)
Total CHOL/HDL Ratio: 5.2 Ratio — ABNORMAL HIGH (ref <=5.0)
Triglycerides: 519 mg/dL — ABNORMAL HIGH (ref <150)

## 2015-04-21 LAB — COMPLETE METABOLIC PANEL WITHOUT GFR
AST: 28 U/L (ref 10–40)
Albumin: 4.3 g/dL (ref 3.6–5.1)
CO2: 24 mmol/L (ref 20–31)
Chloride: 104 mmol/L (ref 98–110)
GFR, Est African American: >89 mL/min (ref >=60)
Glucose, Bld: 95 mg/dL (ref 65–99)
Potassium: 3.9 mmol/L (ref 3.5–5.3)
Total Protein: 7 g/dL (ref 6.1–8.1)

## 2015-04-21 LAB — RPR

## 2015-04-21 LAB — HEPATITIS C ANTIBODY: HCV Ab: NEGATIVE

## 2015-04-21 LAB — HEPATITIS B SURFACE ANTIGEN: Hepatitis B Surface Ag: NEGATIVE

## 2015-04-21 LAB — HEPATITIS B SURFACE ANTIBODY, QUANTITATIVE: Hep B S AB Quant (Post): 0 m[IU]/mL

## 2015-04-21 LAB — TSH: TSH: 3.102 u[IU]/mL (ref 0.350–4.500)

## 2015-04-21 LAB — LIPASE: Lipase: 38 U/L (ref 7–60)

## 2015-04-21 LAB — HIV ANTIBODY (ROUTINE TESTING W REFLEX): HIV 1&2 Ab, 4th Generation: NONREACTIVE

## 2015-04-22 LAB — GC/CHLAMYDIA PROBE AMP
CT Probe RNA: NEGATIVE
GC Probe RNA: NEGATIVE

## 2015-04-26 LAB — CBC

## 2015-04-29 ENCOUNTER — Encounter (HOSPITAL_COMMUNITY): Payer: Self-pay | Admitting: Emergency Medicine

## 2015-05-20 ENCOUNTER — Encounter: Payer: Self-pay | Admitting: Family Medicine

## 2015-05-20 ENCOUNTER — Telehealth: Payer: Self-pay | Admitting: Family Medicine

## 2015-05-20 MED ORDER — LOVASTATIN 20 MG PO TABS: 20.0000 mg | ORAL_TABLET | Freq: Every day | ORAL | Status: DC

## 2015-05-20 NOTE — Telephone Encounter (Signed)
LM that needs to be on cholesterol meds will send in statin since will will help overall lipid profile if no improvement is TG numbers then will also add fenofibrate

## 2015-07-22 ENCOUNTER — Ambulatory Visit (INDEPENDENT_AMBULATORY_CARE_PROVIDER_SITE_OTHER): Payer: BLUE CROSS/BLUE SHIELD | Admitting: Internal Medicine

## 2015-07-22 VITALS — BP 108/60 | HR 85 | Temp 97.9°F | Resp 16 | Ht 63.5 in | Wt 168.2 lb

## 2015-07-22 DIAGNOSIS — E781 Pure hyperglyceridemia: Secondary | ICD-10-CM | POA: Diagnosis not present

## 2015-07-22 DIAGNOSIS — R351 Nocturia: Secondary | ICD-10-CM | POA: Diagnosis not present

## 2015-07-22 DIAGNOSIS — M545 Low back pain, unspecified: Secondary | ICD-10-CM

## 2015-07-22 DIAGNOSIS — R1012 Left upper quadrant pain: Secondary | ICD-10-CM | POA: Diagnosis not present

## 2015-07-22 DIAGNOSIS — E669 Obesity, unspecified: Secondary | ICD-10-CM | POA: Diagnosis not present

## 2015-07-22 DIAGNOSIS — R1011 Right upper quadrant pain: Secondary | ICD-10-CM

## 2015-07-22 DIAGNOSIS — R350 Frequency of micturition: Secondary | ICD-10-CM | POA: Diagnosis not present

## 2015-07-22 LAB — POCT CBC
Granulocyte percent: 51.7 %G (ref 37–80)
HEMATOCRIT: 45.9 % (ref 43.5–53.7)
Hemoglobin: 15.8 g/dL (ref 14.1–18.1)
LYMPH, POC: 2 (ref 0.6–3.4)
MCH, POC: 29.8 pg (ref 27–31.2)
MCHC: 34.5 g/dL (ref 31.8–35.4)
MCV: 86.4 fL (ref 80–97)
MID (cbc): 0.4 (ref 0–0.9)
MPV: 7.6 fL (ref 0–99.8)
POC GRANULOCYTE: 2.6 (ref 2–6.9)
POC LYMPH %: 40.9 % (ref 10–50)
POC MID %: 7.4 %M (ref 0–12)
Platelet Count, POC: 187 10*3/uL (ref 142–424)
RBC: 5.32 M/uL (ref 4.69–6.13)
RDW, POC: 12.5 %
WBC: 5 10*3/uL (ref 4.6–10.2)

## 2015-07-22 LAB — POCT URINALYSIS DIP (MANUAL ENTRY)
BILIRUBIN UA: NEGATIVE
BILIRUBIN UA: NEGATIVE
Glucose, UA: NEGATIVE
Leukocytes, UA: NEGATIVE
Nitrite, UA: NEGATIVE
PH UA: 7
PROTEIN UA: NEGATIVE
RBC UA: NEGATIVE
SPEC GRAV UA: 1.015
Urobilinogen, UA: 0.2

## 2015-07-22 LAB — HEPATIC FUNCTION PANEL
ALBUMIN: 4.5 g/dL (ref 3.6–5.1)
ALT: 37 U/L (ref 9–46)
AST: 25 U/L (ref 10–40)
Alkaline Phosphatase: 62 U/L (ref 40–115)
BILIRUBIN DIRECT: 0.1 mg/dL (ref <=0.2)
Indirect Bilirubin: 0.4 mg/dL (ref 0.2–1.2)
TOTAL PROTEIN: 7.3 g/dL (ref 6.1–8.1)
Total Bilirubin: 0.5 mg/dL (ref 0.2–1.2)

## 2015-07-22 LAB — POC MICROSCOPIC URINALYSIS (UMFC): Mucus: ABSENT

## 2015-07-22 LAB — LIPID PANEL
CHOL/HDL RATIO: 4 ratio (ref <=5.0)
CHOLESTEROL: 137 mg/dL (ref 125–200)
HDL: 34 mg/dL — AB (ref >=40)
LDL Cholesterol: 70 mg/dL (ref <130)
Triglycerides: 167 mg/dL — ABNORMAL HIGH (ref <150)
VLDL: 33 mg/dL — AB (ref <30)

## 2015-07-22 MED ORDER — POLYETHYLENE GLYCOL 3350 17 G PO PACK: 17.0000 g | PACK | Freq: Two times a day (BID) | ORAL | Status: DC

## 2015-07-22 MED ORDER — DICYCLOMINE HCL 20 MG PO TABS: 20.0000 mg | ORAL_TABLET | Freq: Four times a day (QID) | ORAL | Status: DC

## 2015-07-22 NOTE — Progress Notes (Signed)
Subjective:  This chart was scribed for Edward Sia, MD by [name], ED Scribe. This patient was seen in room 9 and the patient's care was started at 8:58 AM.   Patient ID: Edward Harrell, male    DOB: Sep 25, 1970, 44 y.o.   MRN: [id number]  HPI   Chief Complaint  Patient presents with  . stomach pain    right side x 1 week--intermittent problem for 6 months //also has left upper quadrant pain and present for several years off and on. Note GI evaluation 2 years ago. He continues to have constipation is no longer taking medicines area   . Back Pain    lower back pain--Present for several years. Usually wakes in the morning or at night. Improved during the day when he is active. There are no radicular symptoms.  . bloodwork    cholesterol, pt states he is fasting//he was here in August with marked hypertriglyceridemia and was started on Mevacor which she took for about a month and quit because it made him tired .   . constipation   HPI Comments: Edward Harrell is a 44 y.o. male who presents to the [facility] complaining of  mild right sided abdominal pain that began 1 week ago. Pt was evaluated  by GI, Dr. [name] in 10/2014 and diagnosed with spleenic flexure syndrome   He reports associated constipation, urinary frequency/nocturia, wakes up about 6 times during the night to urinate, as well as constant low back pain, worse in the morning but gets better through the day with movement. He reports hx of prostate infection 2-3 years ago. See  hx of left sided abdominal pain and constipation. Unclear how much he is drinking. See past history.  Pt's cholesterol was elevated  last visit.  He was taking lovastatin but states he did not like taking this medication, made him feel uncomfortable.   Patient Active Problem List   Diagnosis Date Noted  . 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 IBS (irritable bowel syndrome) 10/26/2014  . GERD (gastroesophageal reflux  disease) 10/26/2014     Past Medical History  Diagnosis Date  . Alcoholic gastritis   . 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 Hypercholesterolemia   . Colitis   . Hypertension   . Arrhythmia   . Alcohol abuse    Prior to Admission medications   Medication Sig Start Date End Date Taking? Authorizing Provider  lovastatin (MEVACOR) 20 MG tablet Take 1 tablet (20 mg total) by mouth at bedtime. Follow-up for labs in 2 months, may cause muscle aches, abdominal pain 05/20/15  Yes [name], DO  hydrOXYzine (ATARAX/VISTARIL) 10 MG tablet Take 1 tablet (10 mg total) by mouth every 6 (six) hours as needed for itching. 02/02/15   [name], PA-C  Multiple Vitamin (MULTIVITAMIN) tablet Take 1 tablet by mouth daily.    Historical Provider, MD  nystatin cream (MYCOSTATIN) Apply 1 application topically 2 (two) times daily. 04/20/15   [name], DO  omeprazole (PRILOSEC) 20 MG capsule Take 1 capsule (20 mg total) by mouth daily. 10/22/14   [name], MD  OVER THE COUNTER MEDICATION Take 1 tablet by mouth daily as needed (pain/fever). "xl3"    Historical Provider, MD  polyethylene glycol (MIRALAX / GLYCOLAX) packet Take 17 g by mouth daily as needed for moderate constipation.    Historical Provider, MD  traMADol (ULTRAM) 50 MG tablet Take 1 tablet (50 mg total) by  mouth every 6 (six) hours as needed. 09/12/13   [name], MD   Review of Systems  Gastrointestinal: Positive for abdominal pain and constipation. Negative for vomiting.  Genitourinary: Positive for frequency.  Musculoskeletal: Positive for myalgias and back pain.       Objective:   Physical Exam  Constitutional: He is oriented to person, place, and time. He appears well-developed and well-nourished. No distress.  HENT:  Head: Normocephalic and atraumatic.  Eyes: Conjunctivae and EOM are normal.  Neck: Neck supple.  Cardiovascular: Normal rate.   Pulmonary/Chest: Effort normal.  Abdominal: He exhibits no mass. There is no  tenderness.  Obese abdomen, non tender. No masses.   Genitourinary:  Prostate-Symmetrical. Mildly tender without nodules.   Musculoskeletal: Normal range of motion.  straight leg raise negative to 90 degrees.   Neurological: He is alert and oriented to person, place, and time.  Skin: Skin is warm and dry.  Psychiatric: He has a normal mood and affect. His behavior is normal.  Nursing note and vitals reviewed.  Filed Vitals:   07/22/15 0846  BP: 108/60  Pulse: 85  Temp: 97.9 F (36.6 C)  TempSrc: Oral  Resp: 16  Height: 5' 3.5" (1.613 m)  Weight: 168 lb 3.2 oz (76.295 kg)  SpO2: 98%    Results for orders placed or performed in visit on 07/22/15  POCT CBC  Result Value Ref Range   WBC 5.0 4.6 - 10.2 K/uL   Lymph, poc 2.0 0.6 - 3.4   POC LYMPH PERCENT 40.9 10 - 50 %L   MID (cbc) 0.4 0 - 0.9   POC MID % 7.4 0 - 12 %M   POC Granulocyte 2.6 2 - 6.9   Granulocyte percent 51.7 37 - 80 %G   RBC 5.32 4.69 - 6.13 M/uL   Hemoglobin 15.8 14.1 - 18.1 g/dL   HCT, POC 29.5 62.1 - 53.7 %   MCV 86.4 80 - 97 fL   MCH, POC 29.8 27 - 31.2 pg   MCHC 34.5 31.8 - 35.4 g/dL   RDW, POC 30.8 %   Platelet Count, POC 187 142 - 424 K/uL   MPV 7.6 0 - 99.8 fL  POCT urinalysis dipstick  Result Value Ref Range   Color, UA yellow yellow   Clarity, UA clear clear   Glucose, UA negative negative   Bilirubin, UA negative negative   Ketones, POC UA negative negative   Spec Grav, UA 1.015    Blood, UA negative negative   pH, UA 7.0    Protein Ur, POC negative negative   Urobilinogen, UA 0.2    Nitrite, UA Negative Negative   Leukocytes, UA Negative Negative  POCT Microscopic Urinalysis ([facility])  Result Value Ref Range   WBC,UR,HPF,POC None None WBC/hpf   RBC,UR,HPF,POC None None RBC/hpf   Bacteria None None, Too numerous to count   Mucus Absent Absent   Epithelial Cells, UR Per Microscopy None None, Too numerous to count cells/hpf   Assessment & Plan:   1. Hypertriglyceridemia   2.  Abdominal pain, RUQ   3. Abdominal pain, LUQ   4. Urinary frequency   5. Nocturia   6. Obesity   7. Bilateral low back pain without sciatica   His exam cannot reveal any abdominal tenderness or back tenderness. I believe he has back muscle spasm secondary to his obesity and lack of conditioning and is suggested rehabilitation program. Prostate exam was not tenderness to suggest infection in his urine is clear. His  past history is very significant constipation with other etiology abdominal pain having been ruled out gastroenterology. Lab work to investigate liver and gallbladder and start him on an anticonstipation regimen that the gastroenterologist suggested.    Orders Placed This Encounter  Procedures  . Lipid panel  . Hepatic function panel  . PSA  . POCT CBC  . POCT urinalysis dipstick  . POCT Microscopic Urinalysis ([facility])    Meds ordered this encounter  Medications  . dicyclomine (BENTYL) 20 MG tablet    Sig: Take 1 tablet (20 mg total) by mouth every 6 (six) hours. As needed for any stomach cramps    Dispense:  30 tablet    Refill:  1  . polyethylene glycol (MIRALAX / GLYCOLAX) packet    Sig: Take 17 g by mouth 2 (two) times daily.    Dispense:  14 each    Refill:  5     By signing my name below, I, Raven Small, attest that this documentation has been prepared under the direction and in the presence of Edward Siaobert Doolittle, MD.  Electronically Signed: Andrew Auaven Small, ED Scribe. 07/22/2015. 10:06 AM.  I have completed the patient encounter in its entirety as documented by the scribe, with editing by me where necessary. Robert P. Merla Richesoolittle, M.D.

## 2015-07-23 LAB — PSA: PSA: 0.67 ng/mL (ref <=4.00)

## 2015-07-25 ENCOUNTER — Encounter: Payer: Self-pay | Admitting: Internal Medicine

## 2015-08-16 ENCOUNTER — Emergency Department (HOSPITAL_COMMUNITY)
Admission: EM | Admit: 2015-08-16 | Discharge: 2015-08-16 | Disposition: A | Discharge status: Home / Self Care | Payer: BLUE CROSS/BLUE SHIELD | Attending: Emergency Medicine | Admitting: Emergency Medicine

## 2015-08-16 ENCOUNTER — Encounter (HOSPITAL_COMMUNITY): Payer: Self-pay | Admitting: Emergency Medicine

## 2015-08-16 DIAGNOSIS — E7800 Pure hypercholesterolemia, unspecified: Secondary | ICD-10-CM | POA: Insufficient documentation

## 2015-08-16 DIAGNOSIS — I10 Essential (primary) hypertension: Secondary | ICD-10-CM | POA: Insufficient documentation

## 2015-08-16 DIAGNOSIS — R112 Nausea with vomiting, unspecified: Secondary | ICD-10-CM

## 2015-08-16 DIAGNOSIS — Z8719 Personal history of other diseases of the digestive system: Secondary | ICD-10-CM | POA: Insufficient documentation

## 2015-08-16 DIAGNOSIS — Z79899 Other long term (current) drug therapy: Secondary | ICD-10-CM | POA: Diagnosis not present

## 2015-08-16 DIAGNOSIS — R197 Diarrhea, unspecified: Secondary | ICD-10-CM | POA: Insufficient documentation

## 2015-08-16 LAB — COMPREHENSIVE METABOLIC PANEL
ALT: 36 U/L (ref 17–63)
AST: 25 U/L (ref 15–41)
Albumin: 3.8 g/dL (ref 3.5–5.0)
Alkaline Phosphatase: 61 U/L (ref 38–126)
Anion gap: 8 (ref 5–15)
BUN: 11 mg/dL (ref 6–20)
CO2: 23 mmol/L (ref 22–32)
Calcium: 8.5 mg/dL — ABNORMAL LOW (ref 8.9–10.3)
Chloride: 106 mmol/L (ref 101–111)
Creatinine, Ser: 0.8 mg/dL (ref 0.61–1.24)
GFR calc Af Amer: >60 mL/min (ref >60)
GFR calc non Af Amer: >60 mL/min (ref >60)
Glucose, Bld: 107 mg/dL — ABNORMAL HIGH (ref 65–99)
Potassium: 3.6 mmol/L (ref 3.5–5.1)
Sodium: 137 mmol/L (ref 135–145)
Total Bilirubin: 0.8 mg/dL (ref 0.3–1.2)
Total Protein: 6.7 g/dL (ref 6.5–8.1)

## 2015-08-16 LAB — URINALYSIS, ROUTINE W REFLEX MICROSCOPIC
BILIRUBIN URINE: NEGATIVE
Glucose, UA: NEGATIVE mg/dL
Hgb urine dipstick: NEGATIVE
KETONES UR: NEGATIVE mg/dL
LEUKOCYTES UA: NEGATIVE
NITRITE: NEGATIVE
PH: 6 (ref 5.0–8.0)
PROTEIN: NEGATIVE mg/dL
Specific Gravity, Urine: 1.01 (ref 1.005–1.030)

## 2015-08-16 LAB — CBC
HEMATOCRIT: 44.8 % (ref 39.0–52.0)
HEMOGLOBIN: 15.6 g/dL (ref 13.0–17.0)
MCH: 30.2 pg (ref 26.0–34.0)
MCHC: 34.8 g/dL (ref 30.0–36.0)
MCV: 86.8 fL (ref 78.0–100.0)
Platelets: 184 10*3/uL (ref 150–400)
RBC: 5.16 MIL/uL (ref 4.22–5.81)
RDW: 12.6 % (ref 11.5–15.5)
WBC: 8.5 10*3/uL (ref 4.0–10.5)

## 2015-08-16 MED ORDER — ONDANSETRON 4 MG PO TBDP: 4.0000 mg | ORAL_TABLET | Freq: Three times a day (TID) | ORAL | Status: DC | PRN

## 2015-08-16 MED ORDER — LOPERAMIDE HCL 2 MG PO CAPS
4.0000 mg | ORAL_CAPSULE | Freq: Once | ORAL | Status: AC
  Administered 2015-08-16: 4 mg via ORAL
  Filled 2015-08-16: qty 2

## 2015-08-16 MED ORDER — ONDANSETRON 4 MG PO TBDP
4.0000 mg | ORAL_TABLET | Freq: Once | ORAL | Status: AC
  Administered 2015-08-16: 4 mg via ORAL
  Filled 2015-08-16: qty 1

## 2015-08-16 MED ORDER — LOPERAMIDE HCL 2 MG PO CAPS: 2.0000 mg | ORAL_CAPSULE | ORAL | Status: DC | PRN

## 2015-08-16 NOTE — ED Provider Notes (Signed)
CSN: [id number]   Arrival date & time 08/16/15 0125  History  By signing my name below, I, Bethel Born, attest that this documentation has been prepared under the direction and in the presence of Shon Baton, MD. Electronically Signed: Bethel Born, ED Scribe. 08/16/2015. 3:51 AM.  Chief Complaint  Patient presents with  . Diarrhea    HPI The history is provided by the patient. No language interpreter was used.   TOD ABRAHAMSEN is a 44 y.o. male with history of gastritis, colitis, and alcohol abuse who presents to the Emergency Department complaining of diarrhea with onset yesterday. He had no intervention PTA.  Associated symptoms include 1 episode of emesis yesterday and feeling hot.  Pt denies hematochezia, abdominal pain, fever, chest pain, and SOB. He has had no known sick contact. Pt states that he drinks heavily.   Past Medical History  Diagnosis Date  . Alcoholic gastritis   . 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 Hypercholesterolemia   . Colitis   . Hypertension   . Arrhythmia   . Alcohol abuse     Past Surgical History  Procedure Laterality Date  . Hernia repair      Family History  Problem Relation Age of Onset  . Liver cancer Maternal Uncle   . Stomach cancer Maternal Grandmother   . Diabetes Mother   . Liver disease Maternal Uncle     Social History  Substance Use Topics  . Smoking status: Never Smoker   . Smokeless tobacco: Never Used  . Alcohol Use: 0.0 oz/week    0 Standard drinks or equivalent per week     Comment: occ.     Review of Systems  Constitutional: Negative for fever and chills.       Feeling hot  Respiratory: Negative for shortness of breath.   Cardiovascular: Negative for chest pain.  Gastrointestinal: Positive for vomiting and diarrhea. Negative for nausea, abdominal pain and blood in stool.  All other systems reviewed and are negative.  Home Medications   Prior to Admission medications   Medication Sig Start  Date End Date Taking? Authorizing Provider  dicyclomine (BENTYL) 20 MG tablet Take 1 tablet (20 mg total) by mouth every 6 (six) hours. As needed for any stomach cramps 07/22/15   Tonye Pearson, MD  hydrOXYzine (ATARAX/VISTARIL) 10 MG tablet Take 1 tablet (10 mg total) by mouth every 6 (six) hours as needed for itching. 02/02/15   [name], PA-C  loperamide (IMODIUM) 2 MG capsule Take 1 capsule (2 mg total) by mouth as needed for diarrhea or loose stools. 08/16/15   Shon Baton, MD  lovastatin (MEVACOR) 20 MG tablet Take 1 tablet (20 mg total) by mouth at bedtime. Follow-up for labs in 2 months, may cause muscle aches, abdominal pain 05/20/15   [name], DO  Multiple Vitamin (MULTIVITAMIN) tablet Take 1 tablet by mouth daily.    Historical Provider, MD  nystatin cream (MYCOSTATIN) Apply 1 application topically 2 (two) times daily. 04/20/15   [name], DO  omeprazole (PRILOSEC) 20 MG capsule Take 1 capsule (20 mg total) by mouth daily. 10/22/14   [name], MD  ondansetron (ZOFRAN-ODT) 4 MG disintegrating tablet Take 1 tablet (4 mg total) by mouth every 8 (eight) hours as needed for nausea or vomiting. 08/16/15   Shon Baton, MD  OVER THE COUNTER MEDICATION Take 1 tablet by mouth daily as needed (pain/fever). "xl3"    Historical Provider, MD  polyethylene glycol (MIRALAX / GLYCOLAX) packet Take 17 g by mouth 2 (two) times daily. 07/22/15   Tonye Pearsonobert P Doolittle, MD  traMADol (ULTRAM) 50 MG tablet Take 1 tablet (50 mg total) by mouth every 6 (six) hours as needed. 09/12/13   Eber HongBrian Miller, MD    Allergies  Review of patient's allergies indicates no known allergies.  Triage Vitals: BP 116/72 mmHg  Pulse 97  Temp(Src) 98.3 F (36.8 C) (Oral)  Resp 16  Ht 5\' 5"  (1.651 m)  Wt 174 lb (78.926 kg)  BMI 28.96 kg/m2  SpO2 97%  Physical Exam  Constitutional: He is oriented to person, place, and time. He appears well-developed and well-nourished.  HENT:  Head: Normocephalic and  atraumatic.  Mouth/Throat: Oropharynx is clear and moist.  Cardiovascular: Normal rate, regular rhythm and normal heart sounds.   No murmur heard. Pulmonary/Chest: Effort normal and breath sounds normal. No respiratory distress. He has no wheezes.  Abdominal: Soft. Bowel sounds are normal. There is no tenderness. There is no rebound and no guarding.  Musculoskeletal: He exhibits no edema.  Neurological: He is alert and oriented to person, place, and time.  Skin: Skin is warm and dry.  Psychiatric: He has a normal mood and affect.  Nursing note and vitals reviewed.   ED Course  Procedures   DIAGNOSTIC STUDIES: Oxygen Saturation is 97% on RA, normal by my interpretation.    COORDINATION OF CARE: 3:49 AM Discussed treatment plan which includes lab work, Imodium, and Zofran with pt at bedside and pt agreed to plan.  Labs Reviewed  COMPREHENSIVE METABOLIC PANEL - Abnormal; Notable for the following:    Glucose, Bld 107 (*)    Calcium 8.5 (*)    All other components within normal limits  CBC  URINALYSIS, ROUTINE W REFLEX MICROSCOPIC (NOT AT [facility])    Imaging Review No results found.  I personally reviewed and evaluated these lab results as a part of my medical decision-making.    MDM   Final diagnoses:  Nausea vomiting and diarrhea    Patient presents with diarrhea. One episode of nonbloody, nonbilious emesis. Nontoxic on exam. Afebrile. Denies abdominal pain. Exam is benign. Low suspicion for emergent intra-abdominal pathology and suspect acute viral syndrome. Basic labwork obtained and reassuring. No evidence of significant dehydration. Patient given Zofran and Imodium. Discuss with patient supportive care. He is in agreement with plan.  After history, exam, and medical workup I feel the patient has been appropriately medically screened and is safe for discharge home. Pertinent diagnoses were discussed with the patient. Patient was given return precautions.  I personally  performed the services described in this documentation, which was scribed in my presence. The recorded information has been reviewed and is accurate.   Shon Batonourtney F Aveline Daus, MD 08/16/15 [id number]

## 2015-08-16 NOTE — Discharge Instructions (Signed)

## 2015-08-16 NOTE — ED Notes (Signed)
Pt. reports multiple diarrhea with body aches onset yesterday and emesis today , denies fever or chills.

## 2016-01-09 ENCOUNTER — Ambulatory Visit (INDEPENDENT_AMBULATORY_CARE_PROVIDER_SITE_OTHER): Payer: BLUE CROSS/BLUE SHIELD | Admitting: Physician Assistant

## 2016-01-09 VITALS — BP 110/72 | HR 66 | Temp 98.0°F | Resp 18 | Ht 64.0 in | Wt 178.0 lb

## 2016-01-09 DIAGNOSIS — R1084 Generalized abdominal pain: Secondary | ICD-10-CM

## 2016-01-09 LAB — POCT CBC
Granulocyte percent: 63.3 %G (ref 37–80)
HCT, POC: 44.8 % (ref 43.5–53.7)
Hemoglobin: 16.3 g/dL (ref 14.1–18.1)
Lymph, poc: 2.8 (ref 0.6–3.4)
MCH, POC: 31.2 pg (ref 27–31.2)
MCHC: 36.5 g/dL — AB (ref 31.8–35.4)
MCV: 85.6 fL (ref 80–97)
MID (CBC): 0.1 (ref 0–0.9)
MPV: 8.2 fL (ref 0–99.8)
PLATELET COUNT, POC: 197 10*3/uL (ref 142–424)
POC Granulocyte: 4.9 (ref 2–6.9)
POC LYMPH %: 35.3 % (ref 10–50)
POC MID %: 1.4 %M (ref 0–12)
RBC: 5.23 M/uL (ref 4.69–6.13)
RDW, POC: 12.8 %
WBC: 7.8 10*3/uL (ref 4.6–10.2)

## 2016-01-09 MED ORDER — DOCUSATE SODIUM 100 MG PO CAPS: 100.0000 mg | ORAL_CAPSULE | Freq: Two times a day (BID) | ORAL | Status: DC

## 2016-01-09 NOTE — Patient Instructions (Signed)
     IF you received an x-ray today, you will receive an invoice from [facility]. Please contact [facility] at [phone] with questions or concerns regarding your invoice.   IF you received labwork today, you will receive an invoice from [facility]. Please contact [facility] at [phone] with questions or concerns regarding your invoice.   Our billing staff will not be able to assist you with questions regarding bills from these companies.  You will be contacted with the lab results as soon as they are available. The fastest way to get your results is to activate your My Chart account. Instructions are located on the last page of this paperwork. If you have not heard from us regarding the results in 2 weeks, please contact this office.      

## 2016-01-09 NOTE — Progress Notes (Signed)
01/09/2016 5:55 PM   DOB: [birth year] / MRN: [id number]  SUBJECTIVE:  Edward Harrell is a 45 y.o. male presenting for abdominal pain and bloating. Reports the pain started this morning and he felt like his liver was swelling. He has been able to eat and drink today without a problem.  His last bowel movement was this morning and was normal for him, however he reports that he will often become constipated.  He has a history of drinking heavily and reports he had 7 normal sized beer 3 days ago. He has a history of IBS and has been off of bentyl for the last two months and has not had any problems.   He has No Known Allergies.   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    He  reports that he has been smoking.  He has never used smokeless tobacco. He reports that he drinks alcohol. He reports that he does not use illicit drugs. He  has no sexual activity history on file. The patient  has past surgical history that includes Hernia repair.  His family history includes Diabetes in his mother; Liver cancer in his maternal uncle; Liver disease in his maternal uncle; Stomach cancer in his maternal grandmother.  Review of Systems  Constitutional: Negative for fever and chills.  Eyes: Negative for blurred vision.  Respiratory: Negative for cough and shortness of breath.   Cardiovascular: Negative for chest pain.  Gastrointestinal: Negative for nausea and abdominal pain.  Genitourinary: Negative for dysuria, urgency and frequency.  Musculoskeletal: Negative for myalgias.  Skin: Negative for rash.  Neurological: Negative for dizziness, tingling and headaches.  Psychiatric/Behavioral: Negative for depression. The patient is not nervous/anxious.     Problem list and medications reviewed and updated by myself where necessary, and exist elsewhere in the encounter.    OBJECTIVE:  BP 110/72 mmHg  Pulse 66  Temp(Src) 98 F (36.7 C) (Oral)  Resp 18  Ht 5\' 4"  (1.626 m)  Wt 178 lb (80.74 kg)  BMI 30.54 kg/m2  SpO2 98%  Physical Exam  Constitutional: He is oriented to person, place, and time. He appears well-developed. He does not appear ill.  Eyes: Conjunctivae and EOM are normal. Pupils are equal, round, and reactive to light.  Cardiovascular: Normal rate.   Pulmonary/Chest: Effort normal.  Abdominal: Bowel sounds are normal. He exhibits no distension and no mass. There is no hepatosplenomegaly. There is no tenderness. There is no rebound, no guarding and no CVA tenderness.  Live span normal in the axillary line.    Musculoskeletal: Normal range of motion.  Neurological: He is alert and oriented to person, place, and time. No cranial nerve deficit. Coordination normal.  Skin: Skin is warm and dry. He is not diaphoretic.  Psychiatric: He has a normal mood and affect.  Nursing note and vitals reviewed.   Results for orders placed or performed in visit on 01/09/16 (from the past 72 hour(s))  POCT CBC     Status: Abnormal   Collection Time: 01/09/16  5:50 PM  Result Value Ref Range   WBC 7.8 4.6 - 10.2 K/uL   Lymph, poc 2.8 0.6 - 3.4   POC LYMPH PERCENT 35.3 10 - 50 %L   MID (cbc) 0.1 0 - 0.9   POC MID % 1.4 0 - 12 %M   POC Granulocyte 4.9 2 - 6.9   Granulocyte percent 63.3  37 - 80 %G   RBC 5.23 4.69 - 6.13 M/uL   Hemoglobin 16.3 14.1 - 18.1 g/dL   HCT, POC 16.1 09.6 - 53.7 %   MCV 85.6 80 - 97 fL   MCH, POC 31.2 27 - 31.2 pg   MCHC 36.5 (A) 31.8 - 35.4 g/dL   RDW, POC 04.5 %   Platelet Count, POC 197 142 - 424 K/uL   MPV 8.2 0 - 99.8 fL    No results found.  ASSESSMENT AND PLAN  [name] was seen today for abdominal pain and bloated.  Diagnoses and all orders for this visit:  Generalized abdominal pain: His exam and CBC are reassuring.  Advised a Colace.  Because of his history of alcohol related problems I am adding on a Lipase  and a CMET, and will contact the patient for follow up as needed.   -     Cancel: CBC -     COMPLETE METABOLIC PANEL WITH GFR -     Lipase -     POCT CBC -     docusate sodium (COLACE) 100 MG capsule; Take 1 capsule (100 mg total) by mouth 2 (two) times daily.    The patient was advised to call or return to clinic if he does not see an improvement in symptoms or to seek the care of the closest emergency department if he worsens with the above plan.   Deliah Boston, MHS, PA-C [facility] 01/09/2016 5:55 PM

## 2016-01-10 LAB — LIPASE: LIPASE: 37 U/L (ref 7–60)

## 2016-01-10 LAB — COMPLETE METABOLIC PANEL WITH GFR
ALK PHOS: 70 U/L (ref 40–115)
ALT: 41 U/L (ref 9–46)
AST: 25 U/L (ref 10–40)
Albumin: 4.4 g/dL (ref 3.6–5.1)
BUN: 16 mg/dL (ref 7–25)
CALCIUM: 9.2 mg/dL (ref 8.6–10.3)
CHLORIDE: 104 mmol/L (ref 98–110)
CO2: 26 mmol/L (ref 20–31)
Creat: 0.84 mg/dL (ref 0.60–1.35)
Glucose, Bld: 91 mg/dL (ref 65–99)
POTASSIUM: 4.2 mmol/L (ref 3.5–5.3)
Sodium: 139 mmol/L (ref 135–146)
Total Bilirubin: 0.4 mg/dL (ref 0.2–1.2)
Total Protein: 7.5 g/dL (ref 6.1–8.1)

## 2016-10-14 ENCOUNTER — Encounter (HOSPITAL_COMMUNITY): Payer: Self-pay | Admitting: *Deleted

## 2016-10-14 DIAGNOSIS — I10 Essential (primary) hypertension: Secondary | ICD-10-CM | POA: Insufficient documentation

## 2016-10-14 DIAGNOSIS — K5900 Constipation, unspecified: Secondary | ICD-10-CM | POA: Insufficient documentation

## 2016-10-14 DIAGNOSIS — F17200 Nicotine dependence, unspecified, uncomplicated: Secondary | ICD-10-CM | POA: Insufficient documentation

## 2016-10-14 DIAGNOSIS — Z79899 Other long term (current) drug therapy: Secondary | ICD-10-CM | POA: Insufficient documentation

## 2016-10-14 DIAGNOSIS — R350 Frequency of micturition: Secondary | ICD-10-CM | POA: Insufficient documentation

## 2016-10-14 NOTE — ED Triage Notes (Signed)
The pt is c/o being constipated for months  His last bm was earlier today a small amount  He  Feels like he needs to do more  He is also c/o generalized body aches since yesterday

## 2016-10-15 ENCOUNTER — Emergency Department (HOSPITAL_COMMUNITY)
Admission: EM | Admit: 2016-10-15 | Discharge: 2016-10-15 | Disposition: A | Discharge status: Home / Self Care | Payer: BLUE CROSS/BLUE SHIELD | Attending: Emergency Medicine | Admitting: Emergency Medicine

## 2016-10-15 DIAGNOSIS — K5900 Constipation, unspecified: Secondary | ICD-10-CM

## 2016-10-15 DIAGNOSIS — R350 Frequency of micturition: Secondary | ICD-10-CM

## 2016-10-15 LAB — I-STAT CHEM 8, ED
BUN: 10 mg/dL (ref 6–20)
Calcium, Ion: 1.15 mmol/L (ref 1.15–1.40)
Chloride: 107 mmol/L (ref 101–111)
Creatinine, Ser: 0.8 mg/dL (ref 0.61–1.24)
Glucose, Bld: 103 mg/dL — ABNORMAL HIGH (ref 65–99)
HEMATOCRIT: 42 % (ref 39.0–52.0)
Hemoglobin: 14.3 g/dL (ref 13.0–17.0)
POTASSIUM: 3.8 mmol/L (ref 3.5–5.1)
SODIUM: 142 mmol/L (ref 135–145)
TCO2: 23 mmol/L (ref 0–100)

## 2016-10-15 LAB — URINALYSIS, ROUTINE W REFLEX MICROSCOPIC
Bilirubin Urine: NEGATIVE
Glucose, UA: NEGATIVE mg/dL
Hgb urine dipstick: NEGATIVE
Ketones, ur: NEGATIVE mg/dL
LEUKOCYTES UA: NEGATIVE
NITRITE: NEGATIVE
PROTEIN: NEGATIVE mg/dL
Specific Gravity, Urine: 1.001 — ABNORMAL LOW (ref 1.005–1.030)
pH: 6 (ref 5.0–8.0)

## 2016-10-15 MED ORDER — POLYETHYLENE GLYCOL 3350 17 G PO PACK: PACK | ORAL | 0 refills | Status: AC

## 2016-10-15 NOTE — ED Provider Notes (Signed)
[facility] Provider Note   CSN: [id number] Arrival date & time: 10/14/16  2321     History   Chief Complaint Chief Complaint  Patient presents with  . Abdominal Pain    HPI Edward Harrell is a 46 y.o. male.  Patient presents with complaint of constipation. No fever, significant abdominal pain, nausea or vomiting. No rectal pain. He has had this problem recurrently. He did not take anything at home prior to coming to the ED. He is also complaining of frequent urination throughout the day and up to 7 or 8 times at night. No history of diabetes or prostate problems.    The history is provided by the patient. No language interpreter was used.    Past Medical History:  Diagnosis Date  . Alcohol abuse   . Alcoholic gastritis   . Arrhythmia   . Colitis   . Hypercholesterolemia   . Hypertension   . 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 IBS (irritable bowel syndrome) 10/26/2014  . GERD (gastroesophageal reflux disease) 10/26/2014    Past Surgical History:  Procedure Laterality Date  . HERNIA REPAIR         Home Medications    Prior to Admission medications   Medication Sig Start Date End Date Taking? Authorizing Provider  naproxen sodium (ANAPROX) 220 MG tablet Take 220 mg by mouth 2 (two) times daily as needed (for pain).   Yes Historical Provider, MD  dicyclomine (BENTYL) 20 MG tablet Take 1 tablet (20 mg total) by mouth every 6 (six) hours. As needed for any stomach cramps Patient not taking: Reported on 10/15/2016 07/22/15   Tonye Pearsonobert P Doolittle, MD  docusate sodium (COLACE) 100 MG capsule Take 1 capsule (100 mg total) by mouth 2 (two) times daily. Patient not taking: Reported on 10/15/2016 01/09/16   Ofilia NeasMichael L Clark, PA-C  hydrOXYzine (ATARAX/VISTARIL) 10 MG tablet Take 1 tablet (10 mg total) by mouth every 6 (six) hours as needed for itching. Patient not  taking: Reported on 10/15/2016 02/02/15   Garlon HatchetLisa M Sanders, PA-C  loperamide (IMODIUM) 2 MG capsule Take 1 capsule (2 mg total) by mouth as needed for diarrhea or loose stools. Patient not taking: Reported on 10/15/2016 08/16/15   Shon Batonourtney F Horton, MD  lovastatin (MEVACOR) 20 MG tablet Take 1 tablet (20 mg total) by mouth at bedtime. Follow-up for labs in 2 months, may cause muscle aches, abdominal pain Patient not taking: Reported on 10/15/2016 05/20/15   [name], DO  nystatin cream (MYCOSTATIN) Apply 1 application topically 2 (two) times daily. Patient not taking: Reported on 10/15/2016 04/20/15   [name], DO  omeprazole (PRILOSEC) 20 MG capsule Take 1 capsule (20 mg total) by mouth daily. Patient not taking: Reported on 10/15/2016 10/22/14   Zadie Rhineonald Wickline, MD  ondansetron (ZOFRAN-ODT) 4 MG disintegrating tablet Take 1 tablet (4 mg total) by mouth every 8 (eight) hours as needed for nausea or vomiting. Patient not taking: Reported on 10/15/2016 08/16/15   Shon Batonourtney F Horton, MD  polyethylene glycol (MIRALAX / GLYCOLAX) packet Take 17 g by mouth 2 (two) times daily. Patient not taking: Reported on 10/15/2016 07/22/15   Tonye Pearsonobert P Doolittle, MD  traMADol (ULTRAM) 50 MG tablet Take 1 tablet (50 mg total) by mouth every 6 (six) hours as needed. Patient not taking: Reported on 10/15/2016 09/12/13   Eber HongBrian Miller, MD  Family History Family History  Problem Relation Age of Onset  . Liver cancer Maternal Uncle   . Stomach cancer Maternal Grandmother   . Diabetes Mother   . Liver disease Maternal Uncle     Social History Social History  Substance Use Topics  . Smoking status: Current Some Day Smoker  . Smokeless tobacco: Never Used  . Alcohol use 0.0 oz/week     Comment: occ.     Allergies   Patient has no known allergies.   Review of Systems Review of Systems  Constitutional: Negative for chills and fever.  Respiratory: Negative.   Cardiovascular: Negative.   Gastrointestinal: Positive for  constipation. Negative for nausea and vomiting.  Endocrine: Positive for polyuria.  Genitourinary: Positive for frequency. Negative for dysuria, hematuria and testicular pain.  Musculoskeletal: Negative.   Skin: Negative.   Neurological: Negative.  Negative for weakness and headaches.     Physical Exam Updated Vital Signs BP 117/84   Pulse 66   Temp 98.2 F (36.8 C) (Oral)   Resp 17   Ht 5\' 5"  (1.651 m)   Wt 79.4 kg   SpO2 93%   BMI 29.12 kg/m   Physical Exam  Constitutional: He is oriented to person, place, and time. He appears well-developed and well-nourished. No distress.  Very comfortable appearing.  HENT:  Head: Normocephalic.  Neck: Normal range of motion. Neck supple.  Cardiovascular: Normal rate and regular rhythm.   No murmur heard. Pulmonary/Chest: Effort normal and breath sounds normal. He has no wheezes. He has no rales.  Abdominal: Soft. Bowel sounds are normal. There is no tenderness. There is no rebound and no guarding.  Musculoskeletal: Normal range of motion.  Neurological: He is alert and oriented to person, place, and time.  Skin: Skin is warm and dry. No rash noted.  Psychiatric: He has a normal mood and affect.     ED Treatments / Results  Labs (all labs ordered are listed, but only abnormal results are displayed) Labs Reviewed  URINALYSIS, ROUTINE W REFLEX MICROSCOPIC - Abnormal; Notable for the following:       Result Value   Color, Urine COLORLESS (*)    Specific Gravity, Urine 1.001 (*)    All other components within normal limits  I-STAT CHEM 8, ED - Abnormal; Notable for the following:    Glucose, Bld 103 (*)    All other components within normal limits   Results for orders placed or performed during the hospital encounter of 10/15/16  Urinalysis, Routine w reflex microscopic  Result Value Ref Range   Color, Urine COLORLESS (A) YELLOW   APPearance CLEAR CLEAR   Specific Gravity, Urine 1.001 (L) 1.005 - 1.030   pH 6.0 5.0 - 8.0    Glucose, UA NEGATIVE NEGATIVE mg/dL   Hgb urine dipstick NEGATIVE NEGATIVE   Bilirubin Urine NEGATIVE NEGATIVE   Ketones, ur NEGATIVE NEGATIVE mg/dL   Protein, ur NEGATIVE NEGATIVE mg/dL   Nitrite NEGATIVE NEGATIVE   Leukocytes, UA NEGATIVE NEGATIVE  I-stat Chem 8, ED  Result Value Ref Range   Sodium 142 135 - 145 mmol/L   Potassium 3.8 3.5 - 5.1 mmol/L   Chloride 107 101 - 111 mmol/L   BUN 10 6 - 20 mg/dL   Creatinine, Ser 1.61 0.61 - 1.24 mg/dL   Glucose, Bld 096 (H) 65 - 99 mg/dL   Calcium, Ion 0.45 4.09 - 1.40 mmol/L   TCO2 23 0 - 100 mmol/L   Hemoglobin 14.3 13.0 - 17.0 g/dL  HCT 42.0 39.0 - 52.0 %     EKG  EKG Interpretation None       Radiology No results found.  Procedures Procedures (including critical care time)  Medications Ordered in ED Medications - No data to display   Initial Impression / Assessment and Plan / ED Course  I have reviewed the triage vital signs and the nursing notes.  Pertinent labs & imaging results that were available during my care of the patient were reviewed by me and considered in my medical decision making (see chart for details).     Patient presents with constipation and urinary frequency. No fever, pain, vomiting. He reports constipation is recurrent. He took a medication from [place] for constipation without relief.   Abdominal exam is benign, soft, nontender.  No surgeries other than umbilical hernia in the past. VSS, afebrile, no tachycardia. He is felt stable for discharge home with Miralax and stool softener. Recommended follow up with PCP for recheck of constipation and for urinary frequency. UA, kidney functions normal.  Final Clinical Impressions(s) / ED Diagnoses   Final diagnoses:  None   1. Constipation 2. Urinary frequency  New Prescriptions New Prescriptions   No medications on file     Elpidio Anis, Cordelia Poche 10/15/16 4132    Shon Baton, MD 10/15/16 2328

## 2017-10-30 ENCOUNTER — Other Ambulatory Visit: Payer: Self-pay

## 2017-10-30 ENCOUNTER — Encounter (HOSPITAL_COMMUNITY): Payer: Self-pay | Admitting: Emergency Medicine

## 2017-10-30 ENCOUNTER — Ambulatory Visit (HOSPITAL_COMMUNITY)
Admission: EM | Admit: 2017-10-30 | Discharge: 2017-10-30 | Disposition: A | Discharge status: Home / Self Care | Payer: Self-pay | Attending: Family Medicine | Admitting: Family Medicine

## 2017-10-30 DIAGNOSIS — R197 Diarrhea, unspecified: Secondary | ICD-10-CM

## 2017-10-30 NOTE — ED Provider Notes (Signed)
[facility]    CSN: [id number] Arrival date & time: 10/30/17  1819     History   Chief Complaint Chief Complaint  Patient presents with  . Diarrhea  . Generalized Body Aches    HPI Edward Harrell is a 47 y.o. male history of liver disease secondary to alcohol presenting today with diarrhea.  States he has had 6 episodes of diarrhea.  He has had occasional abdominal cramping.  He does not have any abdominal pain or cramping at this time.  He is also feeling warm and with body aches.  He denies any nausea or vomiting.  He had a [employer] earlier today.  Denies any blood in the stool.  He is also concerned because his bottom has become raw from using the restroom and wiping.  Also endorsing some heartburn.  Does not have congestion, sore throat, cough. HPI  Past Medical History:  Diagnosis Date  . Alcohol abuse   . Alcoholic gastritis   . Arrhythmia   . Colitis   . Hypercholesterolemia   . Hypertension   . 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 IBS (irritable bowel syndrome) 10/26/2014  . GERD (gastroesophageal reflux disease) 10/26/2014    Past Surgical History:  Procedure Laterality Date  . HERNIA REPAIR         Home Medications    Prior to Admission medications   Medication Sig Start Date End Date Taking? Authorizing Provider  dicyclomine (BENTYL) 20 MG tablet Take 1 tablet (20 mg total) by mouth every 6 (six) hours. As needed for any stomach cramps Patient not taking: Reported on 10/15/2016 07/22/15   Tonye Pearsonoolittle, Robert P, MD  docusate sodium (COLACE) 100 MG capsule Take 1 capsule (100 mg total) by mouth 2 (two) times daily. Patient not taking: Reported on 10/15/2016 01/09/16   Ofilia Neaslark, Michael L, PA-C  hydrOXYzine (ATARAX/VISTARIL) 10 MG tablet Take 1 tablet (10 mg total) by mouth every 6 (six) hours as needed for itching. Patient not taking: Reported on  10/15/2016 02/02/15   Garlon HatchetSanders, Lisa M, PA-C  loperamide (IMODIUM) 2 MG capsule Take 1 capsule (2 mg total) by mouth as needed for diarrhea or loose stools. Patient not taking: Reported on 10/15/2016 08/16/15   Horton, Mayer Maskerourtney F, MD  lovastatin (MEVACOR) 20 MG tablet Take 1 tablet (20 mg total) by mouth at bedtime. Follow-up for labs in 2 months, may cause muscle aches, abdominal pain Patient not taking: Reported on 10/15/2016 05/20/15   Le, Thao P, DO  naproxen sodium (ANAPROX) 220 MG tablet Take 220 mg by mouth 2 (two) times daily as needed (for pain).    [provider]  nystatin cream (MYCOSTATIN) Apply 1 application topically 2 (two) times daily. Patient not taking: Reported on 10/15/2016 04/20/15   Le, Thao P, DO  omeprazole (PRILOSEC) 20 MG capsule Take 1 capsule (20 mg total) by mouth daily. Patient not taking: Reported on 10/15/2016 10/22/14   Zadie RhineWickline, Donald, MD  ondansetron (ZOFRAN-ODT) 4 MG disintegrating tablet Take 1 tablet (4 mg total) by mouth every 8 (eight) hours as needed for nausea or vomiting. Patient not taking: Reported on 10/15/2016 08/16/15   Horton, Mayer Maskerourtney F, MD  polyethylene glycol [facility](MIRALAX) packet Take Miralax up to 3 times daily until bowels move. No more than 3 consecutive days. 10/15/16   Elpidio AnisUpstill, Shari, PA-C  traMADol (ULTRAM) 50 MG tablet Take 1 tablet (  50 mg total) by mouth every 6 (six) hours as needed. Patient not taking: Reported on 10/15/2016 09/12/13   [name], MD    Family History Family History  Problem Relation Age of Onset  . Liver cancer Maternal Uncle   . Stomach cancer Maternal Grandmother   . Diabetes Mother   . Liver disease Maternal Uncle     Social History Social History   Tobacco Use  . Smoking status: Current Some Day Smoker  . Smokeless tobacco: Never Used  Substance Use Topics  . Alcohol use: Yes    Alcohol/week: 0.0 oz    Comment: occ.  . Drug use: No     Allergies   Patient has no known allergies.   Review of  Systems Review of Systems  HENT: Negative for congestion, ear pain and sore throat.   Respiratory: Negative for cough and shortness of breath.   Cardiovascular: Negative for chest pain and palpitations.  Gastrointestinal: Positive for abdominal pain and diarrhea. Negative for blood in stool, nausea and vomiting.  Genitourinary: Negative for dysuria and frequency.  Musculoskeletal: Positive for back pain and myalgias.  Neurological: Positive for weakness. Negative for dizziness, light-headedness and headaches.  All other systems reviewed and are negative.    Physical Exam Triage Vital Signs ED Triage Vitals  Enc Vitals Group     BP 10/30/17 1924 115/66     Pulse Rate 10/30/17 1924 100     Resp 10/30/17 1924 18     Temp 10/30/17 1924 99.3 F (37.4 C)     Temp src --      SpO2 10/30/17 1924 100 %     Weight --      Height --      Head Circumference --      Peak Flow --      Pain Score 10/30/17 1926 8     Pain Loc --      Pain Edu? --      Excl. in GC? --    No data found.  Updated Vital Signs BP 115/66   Pulse 100   Temp 99.3 F (37.4 C)   Resp 18   SpO2 100%   Visual Acuity Right Eye Distance:   Left Eye Distance:   Bilateral Distance:    Right Eye Near:   Left Eye Near:    Bilateral Near:     Physical Exam  Constitutional: He is oriented to person, place, and time. He appears well-developed and well-nourished.  HENT:  Head: Normocephalic and atraumatic.  Mouth/Throat: Oropharynx is clear and moist.  Eyes: Conjunctivae and EOM are normal. Pupils are equal, round, and reactive to light.  Neck: Neck supple.  Cardiovascular: Normal rate and regular rhythm.  No murmur heard. Pulmonary/Chest: Effort normal and breath sounds normal. No respiratory distress.  Abdominal: Soft. There is no tenderness.  Patient has mild tenderness near his umbilical region, no rebound, negative McBurney's negative Rovsing's, negative Murphy's.  Musculoskeletal: He exhibits no  edema.  Neurological: He is alert and oriented to person, place, and time.  Skin: Skin is warm and dry.  Psychiatric: He has a normal mood and affect.  Nursing note and vitals reviewed.    UC Treatments / Results  Labs (all labs ordered are listed, but only abnormal results are displayed) Labs Reviewed - No data to display  EKG  EKG Interpretation None       Radiology No results found.  Procedures Procedures (including critical care time)  Medications Ordered in  UC Medications - No data to display   Initial Impression / Assessment and Plan / UC Course  I have reviewed the triage vital signs and the nursing notes.  Pertinent labs & imaging results that were available during my care of the patient were reviewed by me and considered in my medical decision making (see chart for details).     Symptoms likely from viral gastroenteritis.  Abdomen does not appear acute or peritoneal.  Will treat supportively and symptomatically.  stressed importance of hydration.  Advised he can apply Vaseline or AD ointment to help with irritation.  Recommended dietary precautions.  Advised patient to return if symptoms worsening, develop worsening abdominal pain, lightheadedness, days dizziness. Discussed strict return precautions. Patient verbalized understanding and is agreeable with plan.   Final Clinical Impressions(s) / UC Diagnoses   Final diagnoses:  Diarrhea of presumed infectious origin    ED Discharge Orders    None       Controlled Substance Prescriptions [place] Controlled Substance Registry consulted? Not Applicable   Lew Dawes, [place] 10/30/17 1955

## 2017-10-30 NOTE — ED Triage Notes (Signed)
Pt c/o belly cramping, diarrhea x6 today. Pt also states he feels hot, and hes had body aches all over.

## 2017-10-30 NOTE — Discharge Instructions (Signed)
Your diarrhea appear to have a viral cause. Your symptoms should improve over the next week as your body continues to rid the infectious cause.  Start with clear liquids, then move to plain foods like bananas, rice, applesauce, toast, broth, grits, oatmeal. As those food settle okay you may transition to your normal foods. Avoid spicy and greasy foods as much as possible.  For Diarrhea: This is your body's natural way of getting rid of a virus.  Expect this to slowly resolve over the next few days.  Please return not improving in 4-5 days.  You may apply Vaseline or A and D ointment to the raw area of your behind.  For reflux you may start Zantac twice daily 150 mg, you may get this over-the-counter at the pharmacy.  Preventing dehydration is key! You need to replace the fluid your body is expelling. Drink plenty of fluids, may use Pedialyte or sports drinks.   Please return if you are experiencing blood in your vomit or stool or experiencing dizziness, lightheadedness, extreme fatigue, increased abdominal pain.

## 2018-02-22 ENCOUNTER — Encounter (HOSPITAL_COMMUNITY): Payer: Self-pay

## 2018-02-22 ENCOUNTER — Ambulatory Visit (HOSPITAL_COMMUNITY)
Admission: EM | Admit: 2018-02-22 | Discharge: 2018-02-22 | Disposition: A | Discharge status: Home / Self Care | Payer: Self-pay | Attending: Internal Medicine | Admitting: Internal Medicine

## 2018-02-22 DIAGNOSIS — A6920 Lyme disease, unspecified: Secondary | ICD-10-CM

## 2018-02-22 MED ORDER — DOXYCYCLINE HYCLATE 100 MG PO CAPS: 100.0000 mg | ORAL_CAPSULE | Freq: Two times a day (BID) | ORAL | 0 refills | Status: AC

## 2018-02-22 NOTE — ED Triage Notes (Signed)
Pt presents with possible insect bite to his abdomen x 2 days.

## 2018-02-22 NOTE — ED Provider Notes (Signed)
[facility]    CSN: [id number] Arrival date & time: 02/22/18  1613     History   Chief Complaint No chief complaint on file.   HPI Edward Harrell is a 47 y.o. male.   This is a 47 y.o. Male, came in today for an insect bite on his lower abdomen that occurred 3 days ago. Patient unsure of tick bite. Denies fever, fatigue, headache, neck stiffness, myalgia, arthralgia, swollen lymph nodes. Patient reports the site is itchy.      Past Medical History:  Diagnosis Date  . Alcohol abuse   . Alcoholic gastritis   . Arrhythmia   . Colitis   . Hypercholesterolemia   . Hypertension   . 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 IBS (irritable bowel syndrome) 10/26/2014  . GERD (gastroesophageal reflux disease) 10/26/2014    Past Surgical History:  Procedure Laterality Date  . HERNIA REPAIR         Home Medications    Prior to Admission medications   Medication Sig Start Date End Date Taking? Authorizing Provider  dicyclomine (BENTYL) 20 MG tablet Take 1 tablet (20 mg total) by mouth every 6 (six) hours. As needed for any stomach cramps Patient not taking: Reported on 10/15/2016 07/22/15   Tonye Pearsonoolittle, Robert P, MD  docusate sodium (COLACE) 100 MG capsule Take 1 capsule (100 mg total) by mouth 2 (two) times daily. Patient not taking: Reported on 10/15/2016 01/09/16   Ofilia Neaslark, Michael L, PA-C  doxycycline (VIBRAMYCIN) 100 MG capsule Take 1 capsule (100 mg total) by mouth 2 (two) times daily for 14 days. 02/22/18 03/08/18  Lucia EstelleZheng, Sheyli Horwitz, NP  hydrOXYzine (ATARAX/VISTARIL) 10 MG tablet Take 1 tablet (10 mg total) by mouth every 6 (six) hours as needed for itching. Patient not taking: Reported on 10/15/2016 02/02/15   Garlon HatchetSanders, Lisa M, PA-C  loperamide (IMODIUM) 2 MG capsule Take 1 capsule (2 mg total) by mouth as needed for diarrhea or loose stools. Patient not taking: Reported on  10/15/2016 08/16/15   Horton, Mayer Maskerourtney F, MD  lovastatin (MEVACOR) 20 MG tablet Take 1 tablet (20 mg total) by mouth at bedtime. Follow-up for labs in 2 months, may cause muscle aches, abdominal pain Patient not taking: Reported on 10/15/2016 05/20/15   Le, Thao P, DO  naproxen sodium (ANAPROX) 220 MG tablet Take 220 mg by mouth 2 (two) times daily as needed (for pain).    [provider]  nystatin cream (MYCOSTATIN) Apply 1 application topically 2 (two) times daily. Patient not taking: Reported on 10/15/2016 04/20/15   Le, Thao P, DO  omeprazole (PRILOSEC) 20 MG capsule Take 1 capsule (20 mg total) by mouth daily. Patient not taking: Reported on 10/15/2016 10/22/14   Zadie RhineWickline, Donald, MD  ondansetron (ZOFRAN-ODT) 4 MG disintegrating tablet Take 1 tablet (4 mg total) by mouth every 8 (eight) hours as needed for nausea or vomiting. Patient not taking: Reported on 10/15/2016 08/16/15   Horton, Mayer Maskerourtney F, MD  polyethylene glycol [facility](MIRALAX) packet Take Miralax up to 3 times daily until bowels move. No more than 3 consecutive days. 10/15/16   Elpidio AnisUpstill, Shari, PA-C  traMADol (ULTRAM) 50 MG tablet Take 1 tablet (50 mg total) by mouth every 6 (six) hours as needed. Patient not taking: Reported on 10/15/2016 09/12/13   Eber HongMiller, Brian, MD    Family History Family History  Problem Relation Age of  Onset  . Liver cancer Maternal Uncle   . Stomach cancer Maternal Grandmother   . Diabetes Mother   . Liver disease Maternal Uncle     Social History Social History   Tobacco Use  . Smoking status: Current Some Day Smoker  . Smokeless tobacco: Never Used  Substance Use Topics  . Alcohol use: Yes    Alcohol/week: 0.0 oz    Comment: occ.  . Drug use: No     Allergies   Patient has no known allergies.   Review of Systems Review of Systems  Constitutional:       See HPI     Physical Exam Triage Vital Signs ED Triage Vitals [02/22/18 1641]  Enc Vitals Group     BP (!) 110/55     Pulse       Resp 18     Temp 98.2 F (36.8 C)     Temp src      SpO2 97 %     Weight      Height      Head Circumference      Peak Flow      Pain Score 3     Pain Loc      Pain Edu?      Excl. in GC?    No data found.  Updated Vital Signs BP (!) 110/55   Temp 98.2 F (36.8 C)   Resp 18   SpO2 97%   Physical Exam  Constitutional: He is oriented to person, place, and time. He appears well-developed and well-nourished.  Cardiovascular: Normal rate and regular rhythm.  Pulmonary/Chest: Effort normal and breath sounds normal.  Neurological: He is alert and oriented to person, place, and time.  Skin: Rash noted.  Erythema migran rash noted.   Nursing note and vitals reviewed.     UC Treatments / Results  Labs (all labs ordered are listed, but only abnormal results are displayed) Labs Reviewed - No data to display  EKG None  Radiology No results found.  Procedures Procedures (including critical care time)  Medications Ordered in UC Medications - No data to display  Initial Impression / Assessment and Plan / UC Course  I have reviewed the triage vital signs and the nursing notes.  Pertinent labs & imaging results that were available during my care of the patient were reviewed by me and considered in my medical decision making (see chart for details).   Final Clinical Impressions(s) / UC Diagnoses   Final diagnoses:  Erythema migrans (Lyme disease)   Treating patient with doxycycline 100 mg BID x 14 days. It is too early to obtain a serology testing, therefore labs not obtained today. Education provided and educational handout given to patient.   Discharge Instructions   None    ED Prescriptions    Medication Sig Dispense Auth. Provider   doxycycline (VIBRAMYCIN) 100 MG capsule Take 1 capsule (100 mg total) by mouth 2 (two) times daily for 14 days. 28 capsule Lucia Estelle, NP     Controlled Substance Prescriptions [place] Controlled Substance Registry consulted? Not  Applicable   Lucia Estelle, NP 02/22/18 1800

## 2018-04-29 ENCOUNTER — Encounter (HOSPITAL_COMMUNITY): Payer: Self-pay

## 2018-04-29 ENCOUNTER — Ambulatory Visit (HOSPITAL_COMMUNITY)
Admission: EM | Admit: 2018-04-29 | Discharge: 2018-04-29 | Disposition: A | Discharge status: Home / Self Care | Payer: Self-pay | Attending: Family Medicine | Admitting: Family Medicine

## 2018-04-29 DIAGNOSIS — K2920 Alcoholic gastritis without bleeding: Secondary | ICD-10-CM | POA: Insufficient documentation

## 2018-04-29 DIAGNOSIS — F1010 Alcohol abuse, uncomplicated: Secondary | ICD-10-CM | POA: Insufficient documentation

## 2018-04-29 DIAGNOSIS — K589 Irritable bowel syndrome without diarrhea: Secondary | ICD-10-CM | POA: Insufficient documentation

## 2018-04-29 DIAGNOSIS — E7800 Pure hypercholesterolemia, unspecified: Secondary | ICD-10-CM | POA: Insufficient documentation

## 2018-04-29 DIAGNOSIS — K219 Gastro-esophageal reflux disease without esophagitis: Secondary | ICD-10-CM | POA: Insufficient documentation

## 2018-04-29 DIAGNOSIS — R1011 Right upper quadrant pain: Secondary | ICD-10-CM

## 2018-04-29 DIAGNOSIS — F17200 Nicotine dependence, unspecified, uncomplicated: Secondary | ICD-10-CM | POA: Insufficient documentation

## 2018-04-29 DIAGNOSIS — K709 Alcoholic liver disease, unspecified: Secondary | ICD-10-CM | POA: Insufficient documentation

## 2018-04-29 DIAGNOSIS — I10 Essential (primary) hypertension: Secondary | ICD-10-CM | POA: Insufficient documentation

## 2018-04-29 LAB — COMPREHENSIVE METABOLIC PANEL
ALT: 53 U/L — ABNORMAL HIGH (ref 0–44)
AST: 39 U/L (ref 15–41)
Albumin: 4.3 g/dL (ref 3.5–5.0)
Alkaline Phosphatase: 61 U/L (ref 38–126)
Anion gap: 10 (ref 5–15)
BUN: 16 mg/dL (ref 6–20)
CHLORIDE: 101 mmol/L (ref 98–111)
CO2: 26 mmol/L (ref 22–32)
CREATININE: 1.01 mg/dL (ref 0.61–1.24)
Calcium: 9.2 mg/dL (ref 8.9–10.3)
GFR calc Af Amer: >60 mL/min (ref >60)
GFR calc non Af Amer: >60 mL/min (ref >60)
Glucose, Bld: 94 mg/dL (ref 70–99)
POTASSIUM: 3.5 mmol/L (ref 3.5–5.1)
SODIUM: 137 mmol/L (ref 135–145)
Total Bilirubin: 0.9 mg/dL (ref 0.3–1.2)
Total Protein: 7.5 g/dL (ref 6.5–8.1)

## 2018-04-29 LAB — LIPASE, BLOOD: LIPASE: 53 U/L — AB (ref 11–51)

## 2018-04-29 LAB — AMYLASE: AMYLASE: 62 U/L (ref 28–100)

## 2018-04-29 NOTE — ED Triage Notes (Signed)
Pt presents with lower abdominal pain and lower back pain

## 2018-04-29 NOTE — ED Provider Notes (Signed)
[facility]   [id number] 04/29/18 Arrival Time: 1509  CC: ABDOMINAL DISCOMFORT  SUBJECTIVE:  Arlan OrganMartin J HERNANADEZ is a 47 y.o. male hx significant for alcohol abuse, IBS, GERD, HTN, HDL, colitis, alcoholic gastritis, and liver disease due to alcohol, who presents with complaint of abdominal discomfort that began today.  Denies a specific injury, but admits to drinking approximately 20 beers over the past 2 days.  Localizes discomfort to RUQ and radiates to right scapula.  Describes as intermittent, worsening, and 6/10 in character.  Has not tried OTC medications.  Denies alleviating or aggravating factors.  Reports similar symptoms in the past 1 -2 years ago and diagnosed with constipation.  Last BM 1 hour ago and normal for patient.  Reports 1 episode of vomiting yesterday which he attributes to being hung over.    Denies fever, chills, appetite changes, weight changes, nausea, chest pain, SOB, diarrhea, constipation, hematochezia, melena, dysuria, difficulty urinating, increased frequency or urgency, flank pain, loss of bowel or bladder function.  Patient had blood work about 3 months ago and was normal.    No LMP for male patient.  ROS: As per HPI.  Past Medical History:  Diagnosis Date  . Alcohol abuse   . Alcoholic gastritis   . Arrhythmia   . Colitis   . Hypercholesterolemia   . Hypertension   . 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 HERNIA REPAIR     No Known Allergies No current facility-administered medications on file prior to encounter.    Current Outpatient Medications on File Prior to Encounter  Medication Sig Dispense Refill  . naproxen sodium (ANAPROX) 220 MG tablet Take 220 mg by mouth 2 (two) times daily as needed (for pain).    . polyethylene glycol (MIRALAX) packet Take Miralax up to 3 times daily until bowels move. No more than 3 consecutive days. 9 each 0   Social History   Socioeconomic  History  . Marital status: Single    Spouse name: Not on file  . Number of children: 3  . Years of education: Not on file  . Highest education level: Not on file  Occupational History  . Occupation: Landscaper  Social Needs  . Financial resource strain: Not on file  . Food insecurity:    Worry: Not on file    Inability: Not on file  . Transportation needs:    Medical: Not on file    Non-medical: Not on file  Tobacco Use  . Smoking status: Current Some Day Smoker  . Smokeless tobacco: Never Used  Substance and Sexual Activity  . Alcohol use: Yes    Alcohol/week: 0.0 standard drinks    Comment: occ.  . Drug use: No  . Sexual activity: Not on file  Lifestyle  . Physical activity:    Days per week: Not on file    Minutes per session: Not on file  . Stress: Not on file  Relationships  . Social connections:    Talks on phone: Not on file    Gets together: Not on file    Attends religious service: Not on file    Active member of club or organization: Not on file    Attends meetings of clubs or organizations: Not on file    Relationship status: Not on file  . Intimate partner violence:    Fear of current or ex partner: Not on file    Emotionally abused: Not on  file    Physically abused: Not on file    Forced sexual activity: Not on file  Other Topics Concern  . Not on file  Social History Narrative   ** Merged History Encounter **       ** Data from: 09/11/13 Enc Dept: [facility]       ** Data from: 04/20/15 Enc Dept: [facility]   Separated - 2 daughters Landscaper and server 1 caffeine/day   Family History  Problem Relation Age of Onset  . Liver cancer Maternal Uncle   . Stomach cancer Maternal Grandmother   . Diabetes Mother   . Liver disease Maternal Uncle      OBJECTIVE:  Vitals:   04/29/18 1518  BP: 121/73  Pulse: 72  Resp: 16  Temp: 97.9 F (36.6 C)  TempSrc: Oral  SpO2: 95%    General appearance: AOx3 in no acute distress;  well-appearing, nontoxic appearance HEENT: NCAT.  Oropharynx clear.  Lungs: clear to auscultation bilaterally without adventitious breath sounds Heart: regular rate and rhythm.  Radial pulses 2+ symmetrical bilaterally Abdomen: protuberant, soft, non-distended; normal active bowel sounds; non-tender to light and deep palpation; nontender at McBurney's point; negative Murphy's sign; negative rebound; no guarding; unable to reproduce symptoms on exam Back: no CVA tenderness Extremities: no edema; symmetrical with no gross deformities Skin: warm and dry Neurologic: normal gait Psychological: alert and cooperative; normal mood and affect  MDM:  Discussed patient case with Dr. Tracie HarrierHagler.  Healthy appearing 47 yo male with hx alcohol abuse presents with RUQ discomfort for the past day.  VS stable, afebrile and not tachycardic.  PE unremarkable, unable to reproduce symptoms on exam.  Will check LFTs, amylase, and lipase today.  We will follow up with him regarding the results.  PCP assistance initiated.  [facility] or with [place].  Given strict return and ED precautions.    ASSESSMENT & PLAN:  1. RUQ discomfort    Orders Placed This Encounter  Procedures  . Amylase    Standing Status:   Standing    Number of Occurrences:   1  . Lipase, blood    Standing Status:   Standing    Number of Occurrences:   1  . Comprehensive metabolic panel    Standing Status:   Standing    Number of Occurrences:   1   No orders of the defined types were placed in this encounter.  Rest and push fluids Blood work ordered We will follow up with you regarding the results of your test PCP assistance initiated Please follow up with PCP for further evaluation and management Return or go to the ED if you have any new or worsening symptoms increased pain, fever, chills, nausea, vomiting, changes in bowel or bladder habits  Reviewed expectations re: course of current medical issues.  Questions answered. Outlined signs and symptoms indicating need for more acute intervention. Patient verbalized understanding. After Visit Summary given.   Rennis HardingWurst, Aleck Locklin, PA-C 04/29/18 1622

## 2018-04-29 NOTE — Discharge Instructions (Addendum)
Rest and push fluids Blood work ordered We will follow up with you regarding the results of your test PCP assistance initiated Please follow up with PCP for further evaluation and management Return or go to the ED if you have any new or worsening symptoms increased pain, fever, chills, nausea, vomiting, changes in bowel or bladder habits

## 2018-06-16 ENCOUNTER — Other Ambulatory Visit: Payer: Self-pay

## 2018-06-16 ENCOUNTER — Emergency Department (HOSPITAL_COMMUNITY)
Admission: EM | Admit: 2018-06-16 | Discharge: 2018-06-16 | Disposition: A | Discharge status: Home / Self Care | Payer: Self-pay | Attending: Emergency Medicine | Admitting: Emergency Medicine

## 2018-06-16 ENCOUNTER — Encounter (HOSPITAL_COMMUNITY): Payer: Self-pay | Admitting: *Deleted

## 2018-06-16 ENCOUNTER — Emergency Department (HOSPITAL_COMMUNITY): Payer: Self-pay

## 2018-06-16 DIAGNOSIS — Z87891 Personal history of nicotine dependence: Secondary | ICD-10-CM | POA: Insufficient documentation

## 2018-06-16 DIAGNOSIS — M545 Low back pain, unspecified: Secondary | ICD-10-CM

## 2018-06-16 DIAGNOSIS — M79672 Pain in left foot: Secondary | ICD-10-CM | POA: Insufficient documentation

## 2018-06-16 DIAGNOSIS — I10 Essential (primary) hypertension: Secondary | ICD-10-CM | POA: Insufficient documentation

## 2018-06-16 MED ORDER — MELOXICAM 7.5 MG PO TABS: 15.0000 mg | ORAL_TABLET | Freq: Every day | ORAL | 0 refills | Status: AC

## 2018-06-16 NOTE — ED Provider Notes (Signed)
[facility] EMERGENCY DEPARTMENT Provider Note   CSN: [id number] Arrival date & time: 06/16/18  0422     History   Chief Complaint No chief complaint on file.   HPI Edward Harrell is a 47 y.o. male.  The history is provided by the patient and medical records.    47 year old male with history of alcohol abuse, gastritis, hyperlipidemia, hypertension, presenting to the ED for back pain as well as left foot pain.  States he has been having left foot pain for about a week now, mostly in the left heel.  States worse when he initially stands up from lying or sitting position but improves after he starts walking a few feet.  Denies any numbness or tingling in his foot.  He has no history of diabetes and no wounds or sores of the feet.  States back pain started last night.  Localized to the middle of his lower back without radiation.  He denies any recent injury, trauma, or falls--does report injury about 20 years ago but no residual issues since then.  No numbness or weakness of his legs.  No bowel or bladder incontinence.  No fevers.  No hx of IVDU or cancer.  He has not tried taking any medications prior to arrival.  Past Medical History:  Diagnosis Date  . Alcohol abuse   . Alcoholic gastritis   . Arrhythmia   . Colitis   . Hypercholesterolemia   . Hypertension   . 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 IBS (irritable bowel syndrome) 10/26/2014  . GERD (gastroesophageal reflux disease) 10/26/2014    Past Surgical History:  Procedure Laterality Date  . HERNIA REPAIR          Home Medications    Prior to Admission medications   Medication Sig Start Date End Date Taking? Authorizing Provider  naproxen sodium (ANAPROX) 220 MG tablet Take 220 mg by mouth 2 (two) times daily as needed (for pain).    [provider]  polyethylene glycol (MIRALAX)  packet Take Miralax up to 3 times daily until bowels move. No more than 3 consecutive days. 10/15/16   Elpidio Anis, PA-C    Family History Family History  Problem Relation Age of Onset  . Liver cancer Maternal Uncle   . Stomach cancer Maternal Grandmother   . Diabetes Mother   . Liver disease Maternal Uncle     Social History Social History   Tobacco Use  . Smoking status: Former Games developer  . Smokeless tobacco: Never Used  Substance Use Topics  . Alcohol use: Yes    Alcohol/week: 0.0 standard drinks    Comment: occ.  . Drug use: No     Allergies   Patient has no known allergies.   Review of Systems Review of Systems  Musculoskeletal: Positive for arthralgias and back pain.  All other systems reviewed and are negative.    Physical Exam Updated Vital Signs BP 112/73 (BP Location: Right Arm)   Pulse (!) 57   Temp 97.6 F (36.4 C) (Oral)   Resp 16   Ht 5\' 5"  (1.651 m)   Wt 79.8 kg   SpO2 97%   BMI 29.29 kg/m   Physical Exam  Constitutional: He is oriented to person, place, and time. He appears well-developed and well-nourished.  HENT:  Head: Normocephalic and atraumatic.  Mouth/Throat: Oropharynx is clear and moist.  Eyes: Pupils are equal, round, and reactive to light. Conjunctivae and EOM are normal.  Neck: Normal range of motion.  Cardiovascular: Normal rate, regular rhythm and normal heart sounds.  Pulmonary/Chest: Effort normal and breath sounds normal.  Abdominal: Soft. Bowel sounds are normal.  Musculoskeletal: Normal range of motion.       Lumbar back: He exhibits tenderness and bony tenderness.       Left foot: There is tenderness and bony tenderness.       Feet:  Tenderness of midline lumbar spine and heel of left foot; no acute deformities or swelling; normal strength/sensation of all 4 extremities, normal gait  Neurological: He is alert and oriented to person, place, and time.  Skin: Skin is warm and dry.  Psychiatric: He has a normal mood  and affect.  Nursing note and vitals reviewed.    ED Treatments / Results  Labs (all labs ordered are listed, but only abnormal results are displayed) Labs Reviewed - No data to display  EKG None  Radiology Dg Lumbar Spine Complete  Result Date: 06/16/2018 CLINICAL DATA:  Back pain. EXAM: LUMBAR SPINE - COMPLETE 4+ VIEW COMPARISON:  None. FINDINGS: Five non rib-bearing lumbar-type vertebral bodies are intact. No malalignment. Maintained lumbar lordosis. Moderate L4-5, severe L5-S1 disc height loss with endplate sclerosis and marginal spurring. No destructive bony lesions. Sacroiliac joints are symmetric. Included prevertebral and paraspinal soft tissue planes are non-suspicious. IMPRESSION: 1. No acute fracture deformity or malalignment. 2. Moderate L4-5 and severe L5-S1 degenerative disc. Electronically Signed   By: Awilda Metro M.D.   On: 06/16/2018 05:40   Dg Foot Complete Left  Result Date: 06/16/2018 CLINICAL DATA:  Heel pain. EXAM: LEFT FOOT - COMPLETE 3+ VIEW COMPARISON:  None. FINDINGS: There is no evidence of fracture or dislocation. Tiny plantar calcaneal spur. Bipartite first metatarsal fibular sesamoid. There is no evidence of arthropathy or other focal bone abnormality. Soft tissues are unremarkable. IMPRESSION: No acute osseous injury. Electronically Signed   By: Awilda Metro M.D.   On: 06/16/2018 05:42    Procedures Procedures (including critical care time)  Medications Ordered in ED Medications - No data to display   Initial Impression / Assessment and Plan / ED Course  I have reviewed the triage vital signs and the nursing notes.  Pertinent labs & imaging results that were available during my care of the patient were reviewed by me and considered in my medical decision making (see chart for details).  47 year old male here with patient to discuss back pain as well as left heel pain.  Has been having left heel pain for about a week, back pain since  yesterday evening.  No recent injury, trauma, or falls.  Patient tenderness over.  Heel and midline lumbar spine without noted deformities.  No focal neurologic deficits suggestive of cauda equina.  Imaging obtained--left foot films are normal, lumbar spine films with some degenerative changes.  Remains neurologically intact here, ambulatory with steady gait.  The nature of patient's foot pain is somewhat suspicious for possible plantar fasciitis as worse when first standing up but improves after walking.  Recommend some stretching exercises for home, will start meloxicam.  Encourage close PCP follow-up.  Return here for any new or worsening symptoms.  Final Clinical Impressions(s) / ED Diagnoses   Final diagnoses:  Pain of left heel  Acute midline low back pain without sciatica    ED Discharge Orders         Ordered    meloxicam (  MOBIC) 7.5 MG tablet  Daily     06/16/18 0610           [name], PA-C 06/16/18 1610    Devoria Albe, MD 06/16/18 [phone]

## 2018-06-16 NOTE — Discharge Instructions (Signed)
Take the prescribed medication as directed.  Try to the stretching exercises for your foot to see if this helps. Follow-up with your primary care doctor. Return to the ED for new or worsening symptoms.

## 2018-06-16 NOTE — ED Triage Notes (Signed)
C/o lower back pain and left heel pain. Heel pain started last week , lower back pain started last night. . Denies injury

## 2018-07-21 ENCOUNTER — Ambulatory Visit: Payer: Self-pay

## 2018-07-21 ENCOUNTER — Other Ambulatory Visit: Payer: Self-pay | Admitting: Family Medicine

## 2018-07-21 DIAGNOSIS — M25511 Pain in right shoulder: Secondary | ICD-10-CM

## 2019-05-07 IMAGING — DX DG SHOULDER 2+V*R*
4 series · 4 of 4 positions shown · non-contrast
Comparison: None.

CLINICAL DATA: Acute right shoulder pain after blunt trauma.

EXAM:
RIGHT SHOULDER - 2+ VIEW

[shoulder ap (1 of 2)]
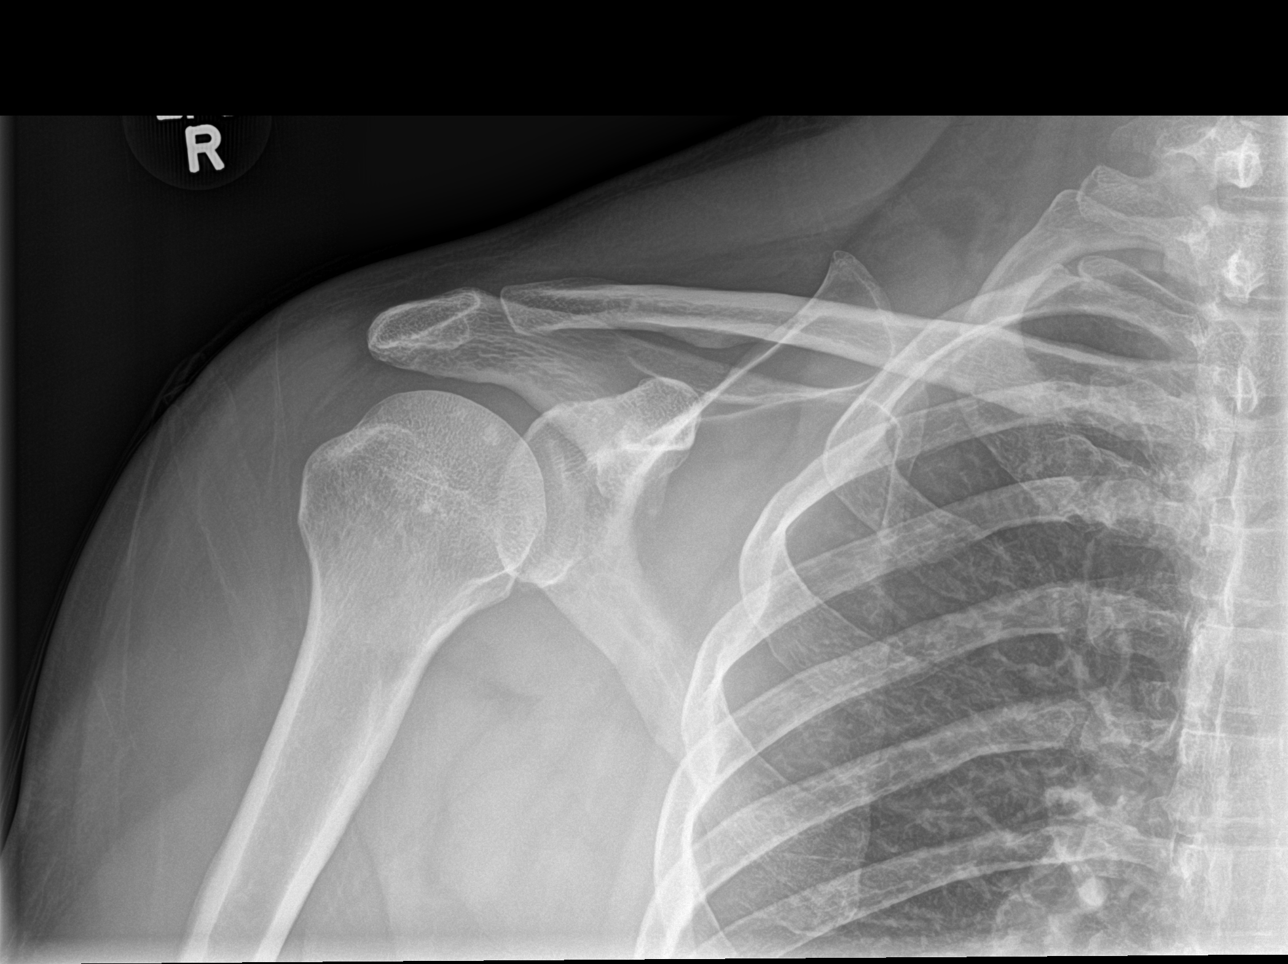

[shoulder ap (2 of 2)]
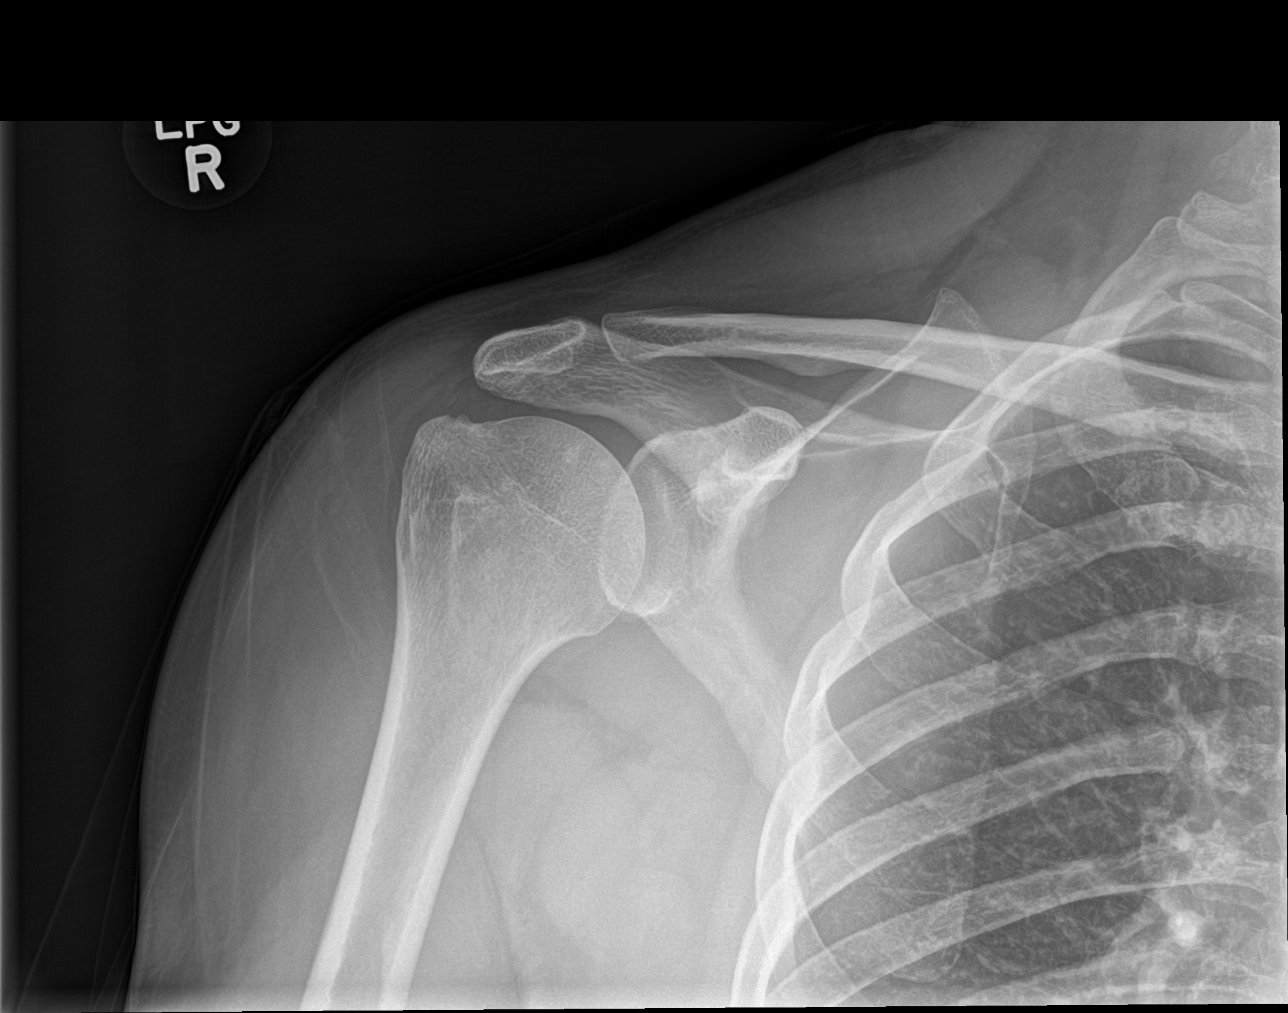

[shoulder y-view]
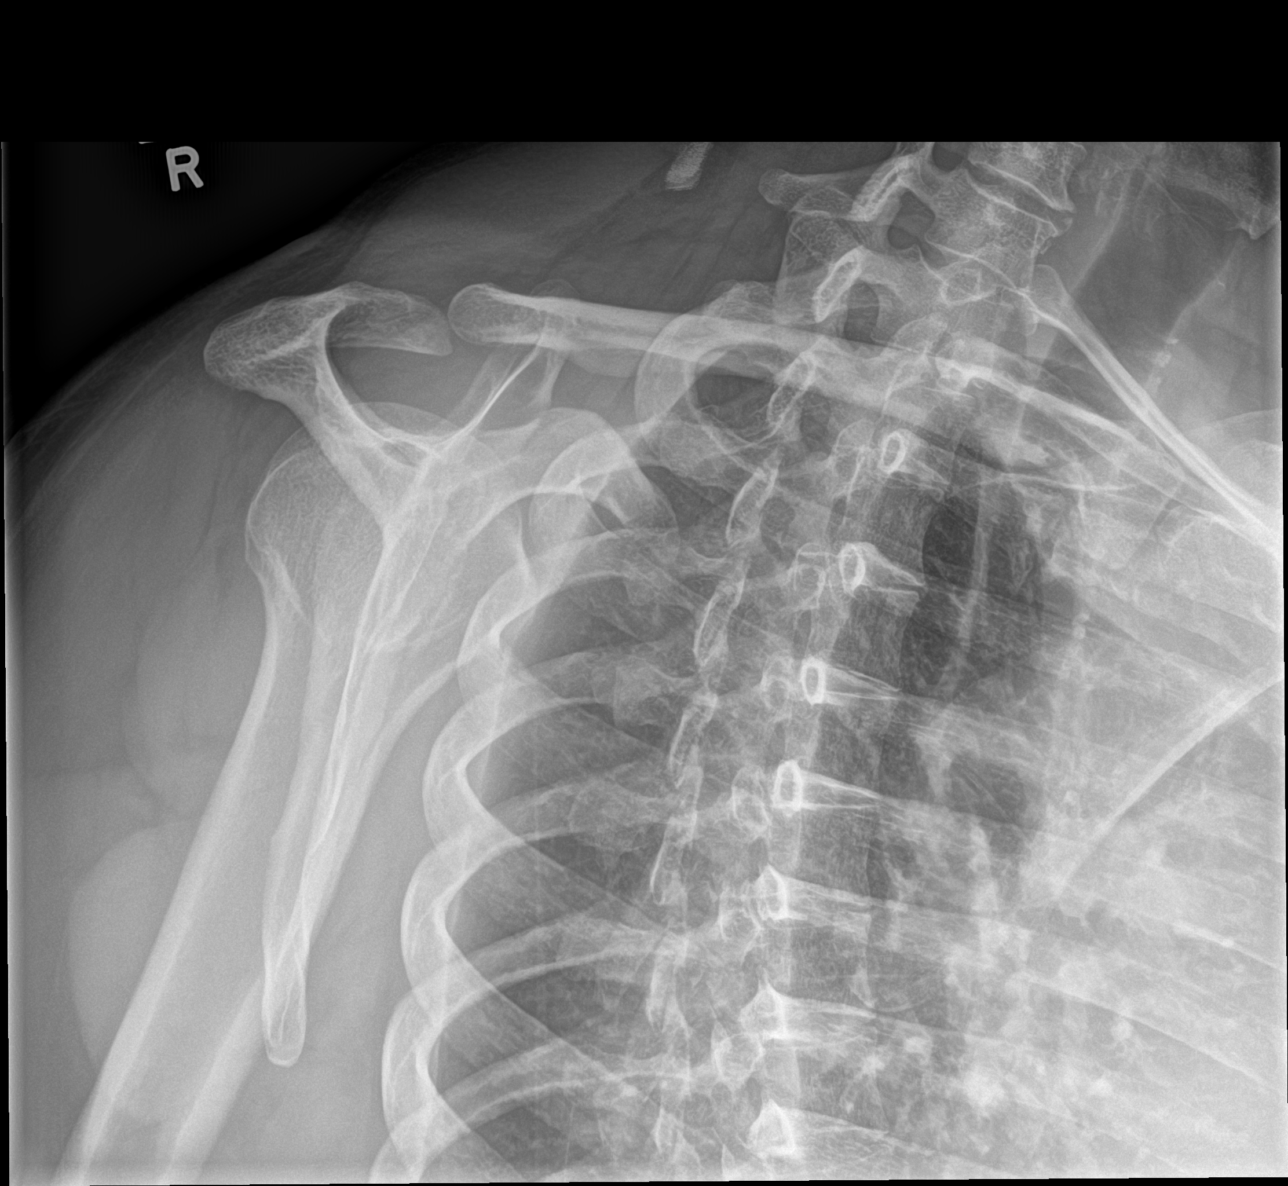

[shoulder axial]
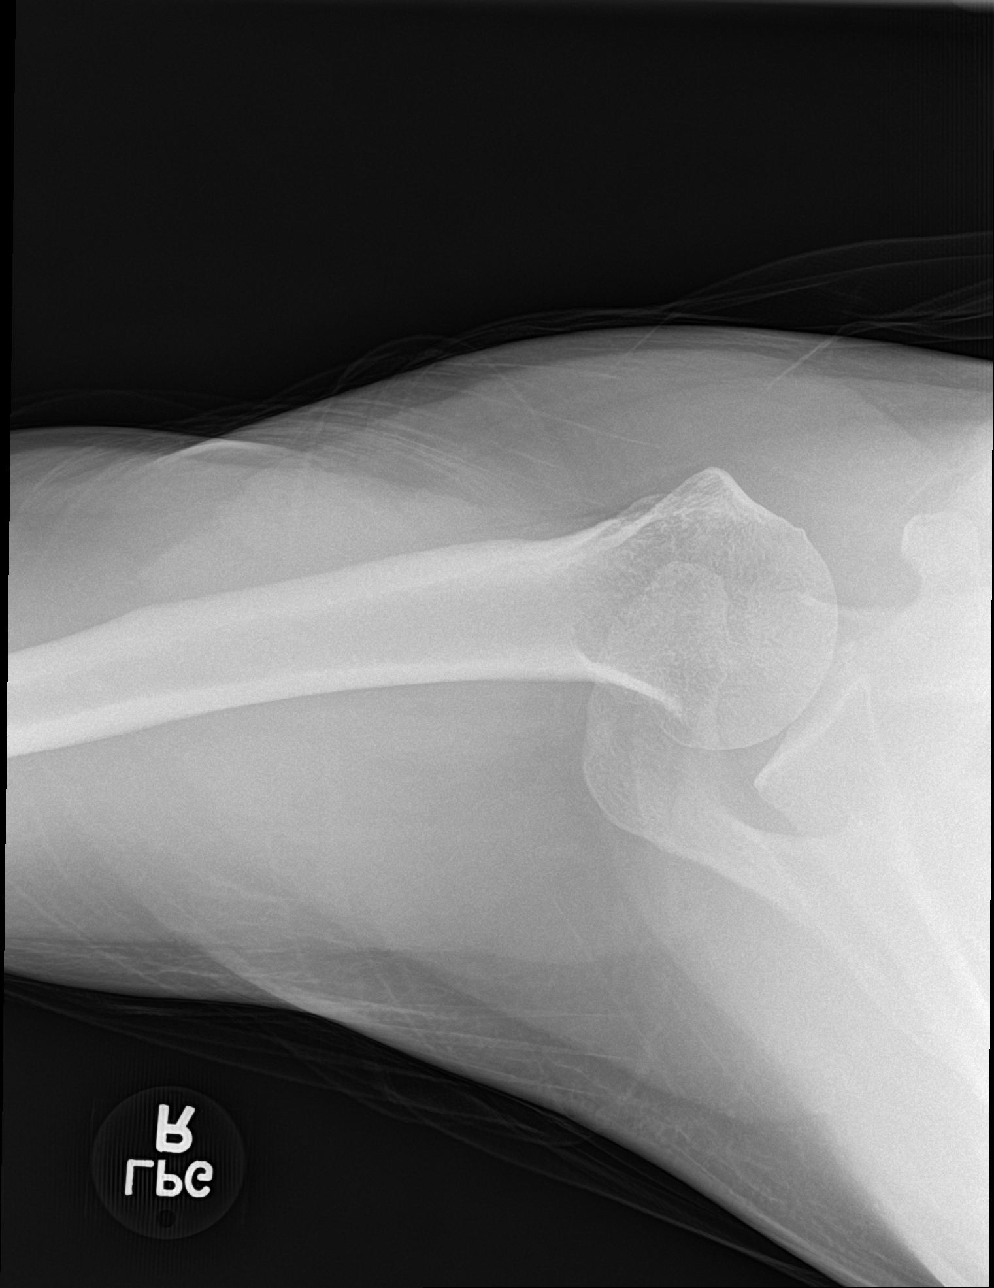

[4 of 4 positions shown; findings below may reference images not displayed]

FINDINGS: There is no evidence of fracture or dislocation. There is no
evidence of arthropathy or other focal bone abnormality. Soft
tissues are unremarkable.
IMPRESSION: Negative.
